# Patient Record
Sex: Male | Born: 1985 | Hispanic: Yes | Marital: Married | Smoking: Never smoker
Health system: Southern US, Community
[De-identification: ages and names within clinical notes are randomized; demographics above are authoritative.]

## PROBLEM LIST (undated history)

## (undated) DIAGNOSIS — H11009 Unspecified pterygium of unspecified eye: Secondary | ICD-10-CM

## (undated) HISTORY — DX: Unspecified pterygium of unspecified eye: H11.009

---

## 2014-02-01 ENCOUNTER — Ambulatory Visit (INDEPENDENT_AMBULATORY_CARE_PROVIDER_SITE_OTHER): Payer: 59 | Admitting: Family Medicine

## 2014-02-01 ENCOUNTER — Ambulatory Visit (INDEPENDENT_AMBULATORY_CARE_PROVIDER_SITE_OTHER): Payer: 59

## 2014-02-01 VITALS — BP 120/72 | HR 65 | Temp 98.2°F | Resp 18 | Ht 64.5 in | Wt 192.0 lb

## 2014-02-01 DIAGNOSIS — S5000XA Contusion of unspecified elbow, initial encounter: Secondary | ICD-10-CM

## 2014-02-01 DIAGNOSIS — M25529 Pain in unspecified elbow: Secondary | ICD-10-CM

## 2014-02-01 DIAGNOSIS — S40021A Contusion of right upper arm, initial encounter: Secondary | ICD-10-CM

## 2014-02-01 DIAGNOSIS — M25521 Pain in right elbow: Secondary | ICD-10-CM

## 2014-02-01 DIAGNOSIS — S40029A Contusion of unspecified upper arm, initial encounter: Secondary | ICD-10-CM

## 2014-02-01 DIAGNOSIS — S5001XA Contusion of right elbow, initial encounter: Secondary | ICD-10-CM

## 2014-02-01 LAB — POCT CBC
Granulocyte percent: 67.3 %G (ref 37–80)
HCT, POC: 43.9 % (ref 43.5–53.7)
Hemoglobin: 14.6 g/dL (ref 14.1–18.1)
Lymph, poc: 2.5 (ref 0.6–3.4)
MCH, POC: 30.2 pg (ref 27–31.2)
MCHC: 33.3 g/dL (ref 31.8–35.4)
MCV: 90.8 fL (ref 80–97)
MID (cbc): 0.6 (ref 0–0.9)
MPV: 8.8 fL (ref 0–99.8)
POC Granulocyte: 6.4 (ref 2–6.9)
POC LYMPH PERCENT: 26.3 %L (ref 10–50)
POC MID %: 6.4 %M (ref 0–12)
Platelet Count, POC: 247 10*3/uL (ref 142–424)
RBC: 4.83 M/uL (ref 4.69–6.13)
RDW, POC: 13.6 %
WBC: 9.5 10*3/uL (ref 4.6–10.2)

## 2014-02-01 MED ORDER — DICLOFENAC SODIUM 75 MG PO TBEC: 75.0000 mg | DELAYED_RELEASE_TABLET | Freq: Two times a day (BID) | ORAL | Status: DC

## 2014-02-01 NOTE — Progress Notes (Signed)
Subjective: Patient was doing some work at home Friday evening and fell from a 4 foot imb of a tree onto a little, striking the right inner elbow area or just above it. He has been hurting. He is now developed a large area of bruising and a painful area of swelling.  Objective: Right hand neurovascular intact. Hand strength good. Rotation of the forearm with pronation and supination causes pain. He has a large hematoma on the medial aspect of elbow which is tender and very ecchymotic. The swelling starts at about the mid biceps but the upper biceps and mid vastus don't seem as tender as he does more distally.  Assessment: Traumatic hematoma from contusion to right elbow with secondary ecchymosis and pain  Plan: X-ray elbow.   Results for orders placed in visit on 02/01/14  POCT CBC      Result Value Ref Range   WBC 9.5  4.6 - 10.2 K/uL   Lymph, poc 2.5  0.6 - 3.4   POC LYMPH PERCENT 26.3  10 - 50 %L   MID (cbc) 0.6  0 - 0.9   POC MID % 6.4  0 - 12 %M   POC Granulocyte 6.4  2 - 6.9   Granulocyte percent 67.3  37 - 80 %G   RBC 4.83  4.69 - 6.13 M/uL   Hemoglobin 14.6  14.1 - 18.1 g/dL   HCT, POC 16.1  09.6 - 53.7 %   MCV 90.8  80 - 97 fL   MCH, POC 30.2  27 - 31.2 pg   MCHC 33.3  31.8 - 35.4 g/dL   RDW, POC 04.5     Platelet Count, POC 247  142 - 424 K/uL   MPV 8.8  0 - 99.8 fL   We will probably just need to treat symptomatically.  [facility] reading (PRIMARY) by  Dr. Alwyn Ren Bones appear normal  Assessment: Traumatic hematoma and ecchymosis left elbow  Plan: Ice Analgesia  Return if not much better over the next week.

## 2014-02-01 NOTE — Patient Instructions (Addendum)
Further bruising is likely to occur, maybe only down into the hand.  Apply ice for about 15 minutes 3 or 4 times daily  Take diclofenac one twice daily for pain and inflammation  Can take Tylenol 1000 milligrams (acetaminophen) 3 times daily if needed for additional pain relief  Out of work through Wednesday

## 2014-02-04 ENCOUNTER — Ambulatory Visit (INDEPENDENT_AMBULATORY_CARE_PROVIDER_SITE_OTHER): Payer: 59 | Admitting: Family Medicine

## 2014-02-04 VITALS — BP 124/76 | HR 67 | Temp 98.2°F | Resp 16 | Ht 66.0 in | Wt 193.4 lb

## 2014-02-04 DIAGNOSIS — Z5189 Encounter for other specified aftercare: Secondary | ICD-10-CM

## 2014-02-04 DIAGNOSIS — S40021D Contusion of right upper arm, subsequent encounter: Secondary | ICD-10-CM

## 2014-02-04 DIAGNOSIS — S40029A Contusion of unspecified upper arm, initial encounter: Secondary | ICD-10-CM

## 2014-02-04 MED ORDER — CYCLOBENZAPRINE HCL 10 MG PO TABS: 10.0000 mg | ORAL_TABLET | Freq: Three times a day (TID) | ORAL | Status: DC | PRN

## 2014-02-04 MED ORDER — HYDROCODONE-ACETAMINOPHEN 5-325 MG PO TABS: 1.0000 | ORAL_TABLET | Freq: Four times a day (QID) | ORAL | Status: DC | PRN

## 2014-02-04 NOTE — Patient Instructions (Addendum)
Continue taking voltaren once a day for the next month.  Make sure you come back to clinic immediately if you are having worsening weakness or decreased ability to fully move your arm.  The cyclobenzaprine (flexeril) and hydrocodone will both make you sleepy and drowsy so do NOT take them before you work or drive.  Sndrome de atrapamiento, Tendinitis del manguito rotador, bursitis con rehabilitacin (Impingement Syndrome, Rotator Cuff, Bursitis, with Rehab) El sndrome de atrapamiento est caracterizado por la inflamacin de los tendones del manguito rotador o la bursa subacromial, que produce dolor en el hombro. El manguito rotador est formado por cuatro tendones y msculos que controlan gran parte de la funcin del hombro y Probation officer. La bursa subacromial es un saco lleno de lquido que ayuda a reducir la friccin entre el manguito rotador y uno de los huesos del hombro (acromion). El sndrome de atrapamiento suele ser Neomia Dear lesin por uso excesivo que causa inflamacin de la bursa (bursitis), inflamacin del tendn (tendinitis), o un desgarro del tendn (esguince). Los esguinces se clasifican en tres categoras. Los esguinces de grado 1 ocasionan dolor, pero el tendn no est alargado. En los esguinces de grado 2 hay un ligamento alargado, debido a un estiramiento o desgarro parcial. En el esguince de [place] 2 an se conserva la funcin, aunque puede estar disminuda. Un esguince en grado 3 es la ruptura completa del msculo o el tendn, y suele quedar incapacitada la funcin. SNTOMAS  Dolor alrededor del hombro, frecuentemente en la porcin externa de la parte superior del brazo.  El dolor empeora al mover el hombro, en especial cuando se lo levanta o se lo coloca por encima de la cabeza.  En ocasiones hay dolor cuando no se utiliza el brazo.  El dolor puede despertarlo durante la noche.  A veces, puede sentir dolor, sensibilidad, hinchazn, calor y enrojecimiento en la zona afectada.  Prdida de  la fuerza.  Movimiento limitado del hombro, en especial para moverlo hacia atrs (cmo para alcanzar el bolsillo trasero o desabrocharse el sostn) o al cruzar el cuerpo.  Ruido de "crack" (crepitacin) al Educational psychologist.  Puede sentir dolor en el tendn del bceps e hinchazn (en la zona anterior del hombro). Empeora al doblar el codo o levantar peso. CAUSAS El sndrome de compresin es a menudo una lesin por abuso, [employer] movimientos crnicos (repetitivos) ocasionan que los tendones o la bursa se inflamen. El esguince se produce cuando se produce una fuerza en el tendn o msculo que es mayor de lo que puede soportar. Los mecanismos ms frecuentes de una lesin son: Esguince provocado por un brusco incremento en la duracin, frecuencia o en la intensidad del entrenamiento.  Golpe directo en el hombro (traumatismo).  Envejecimiento, degeneracin del tendn con el uso normal.  Bulto seo en el hombro (osteofito acromial). LOS RIESGOS AUMENTAN CON  Deportes de contacto (ftbol, lucha o boxeo).  Deportes en los que hay que arrojar objetos (bisbol, tenis y vley).  Levantamiento de pesas y fisicoculturismo.  Trabajos pesados.  Lesin previa en el manguito rotador, inclusive choque.  Poca fuerza y flexibilidad del hombro.  Precalentamiento y elongacin inadecuados antes de la [place].  Equipo protector inadecuado.  La edad  Bulto seo en el hombro (osteofito acromial). MEDIDAS DE PREVENCIN  Precalentamiento adecuado y elongacin antes de la [place].  Descanso y recuperacin entre actividades.  Mantener la forma fsica:  E[place], flexibilidad y resistencia muscular.  Capacidad cardiovascular.  Aprenda y USAA. PRONSTICO Si  se trata adecuadamente, por lo general desaparece en 6 semanas. En algunos casos se requiere [place].  POSIBLES COMPLICACIONES:  Tiempo de curacin prolongado, si no se le da el tiempo suficiente como para  curarse.  La recurrencia frecuente de los sntomas puede dar como resultado un problema crnico.  Rigidez, parlisis del hombro o prdida de movimiento.  Desgarro del tendn del Engineer, drilling.  Recurrencia de sntomas, en especial si se retoma la actividad rpidamente, con el uso excesivo, con un golpe directo o con tcnicas incorrectas. CONSIDERACIONES GENERALES PARA EL TRATAMIENTO El tratamiento inicial consiste en la toma de medicamentos y la aplicacin de hielo para Engineer, materials y reducir la hinchazn. Los ejercicios de elongacin y fortalecimiento pueden ayudar a reducir Chief Technology Officer con la [place]. Los ejercicios pueden Management consultant o con un terapeuta. Si no se obtiene xito con Artist, ser necesario someterse a [place]. Luego de la [place] y rehabilitacin, es posible regresar a Audiological scientist en 3 meses.  MEDICAMENTOS   Si es necesaria la administracin de medicamentos para Chief Technology Officer, se recomiendan los antiinflamatorios no esteroides, (aspirina e ibuprofeno) u otros calmantes menores (acetaminofeno).  No tome medicamentos para el dolor dentro de los [address] previos a la [place].  El profesional podr prescribirle calmantes si lo considera necesario. Utilcelos como se le indique y slo cuando lo necesite.  En algunos casos se indica una inyeccin de corticosteroides. Estas inyecciones deben reservarse para los [place] graves, porque slo se pueden administrar una determinada cantidad de veces. CALOR Y FRO:   El fro debe aplicarse durante 10 a 15 minutos cada 2  3 horas para reducir la inflamacin y Chief Technology Officer e inmediatamente despus de cualquier actividad que agrava los sntomas. Utilice bolsas o un masaje de hielo.  El calor puede usarse antes de Therapist, music y de las actividades de fortalecimiento indicadas por el profesional, el fisioterapeuta o Orthoptist. Utilice una bolsa trmica o un pao hmedo. SOLICITE ATENCIN MDICA SI:   Los  sntomas empeoran o no mejoran en 4 a 6 semanas, an realizando Pharmacist, community.  Desarrolla nuevos e inexplicables sntomas. (las drogas [employer] en el tratamiento le ocasionan efectos secundarios). EJERCICIOS  EJERCICIOS DE AMPLITUD DE MOVIMIENTOS Y ELONGACIN - Sndrome de choque (manguito rotador tendinitis, bursitis) Estos ejercicios le ayudarn en la recuperacin de la lesin. Los sntomas podrn desaparecer con o sin mayor intervencin del profesional, el fisioterapeuta o Orthoptist. Al completar estos ejercicios, recuerde:   Restaurar la flexibilidad del tejido ayuda a que las articulaciones recuperen el movimiento normal. Esto permite que el movimiento y la actividad sea ms saludables y menos dolorosos.  Para que sea efectiva, cada elongacin debe realizarse durante al menos 30 segundos.  La elongacin nunca debe ser dolorosa. Deber sentir slo un alargamiento suave o elongacin del tejido que estira. FUERZA - Flexin, de pie  Pngase de pie con una buena postura. Con un agarre en supinacin en su mano derecha / izquierdo, y un agarre en pronacin de la mano opuesta, agarre un palo de escoba o caa de modo que sus manos queden un poco ms separadas que el ancho de los hombros.  Con los msculos del codo Sales executive / izquierdo rectos y los hombros relajados, empuje el bastn con la mano opuesta, para elevar su brazo derecha / izquierdo delante de su cuerpo y por encima de la cabeza. Levante el brazo hasta que sientas un estiramiento en el hombro derecha / izquierdo, pero sin sentir  un aumento del dolor.  Trate de Community education officer / izquierdo a Advertising account executive, y [place] el omplato inclinado hacia abajo y hacia la espina dorsal media de la espalda. Mantenga esta posicin durante __________ segundos.  Vuelva lentamente a la posicin inicial. Reptalo __________ veces. Realice este estiramiento __________ Anthoney Harada por da. FUERZA - Abduccin, posicin  supina  Acustese sobre la espada. Tome un palo de escoba o caa con una palma derecha / izquierdo hacia abajo y la otra palma hacia arriba de modo que sus manos tengan una separacin de un poco ms que el ancho de sus hombros..  Con los msculos del codo Sales executive / izquierdo rectos y los hombros relajados, empuje el bastn con la mano opuesta, para elevar su brazo derecha / izquierdo delante de su cuerpo y por encima de la cabeza. Levante el brazo hasta que sientas un estiramiento en el hombro derecha / izquierdo, pero sin sentir un aumento del dolor.  Trate de Community education officer / izquierdo a Advertising account executive, y [place] el omplato inclinado hacia abajo y hacia la espina dorsal media de la espalda. Mantenga esta posicin durante __________ segundos.  Vuelva lentamente a la posicin inicial. Reptalo __________ veces. Realice este estiramiento __________ Anthoney Harada por da.  ROM - Flexin, asistida activa  Acustese sobre la espada. Podr doblar las rodillas para estar ms cmodo  Tome un palo de escoba o un bastn de modo que sus manos queden a la misma distancia que sus hombros. La mano derecha / izquierdo debe sujetar el extremo del palo, de modo que la mano est "pulgares arriba", como si estuviera a punto de dar la [place].  Con su brazo sano para Science writer, Administrator / izquierdo sobre la cabeza, Teacher, adult education que sienta un suave estiramiento en el hombro. Mantenga esta posicin durante __________ segundos.  Utilice el bastn para ayudarse a Designer, jewellery / izquierdo a la posicin inicial. Reptalo __________ veces. Realice este estiramiento __________ Anthoney Harada por da.  ROM - Rotacin interna, en posicin supina  Recustese sobre su espalda en una superficie firme. Coloque el codo derecha / izquierdo a 60 grados de distancia de su lado. Eleve el codo con una toalla doblada, de manera que el codo y el hombro estn a la misma altura.  Utilice un palo de  escoba o caa y su brazo sano, tire de su mano derecha / izquierdo hacia su cuerpo hasta que sienta un suave estiramiento, pero ningn aumento en su dolor en el hombro. Mantenga su hombro y el codo en su lugar durante todo el ejercicio.  Mantenga esta posicin durante __________ segundos. Vuelva lentamente a la posicin inicial. Reptalo __________ veces. Realice este estiramiento __________ Anthoney Harada por da. ELONGACIN - Rotacin interna  Coloque la mano derecha / izquierdo detrs de la espalda, con la palma [place] arriba.  Coloque una toalla o un cinturn sobre el hombro opuesto. Tome la toalla con la mano derecha / izquierdo.  Con una postura erguida, tire suavemente hacia arriba la [place], hasta que sienta un estiramiento en la parte frontal del hombro derecha / izquierdo.  Trate de Community education officer / izquierdo a Advertising account executive, y [place] el omplato inclinado hacia abajo y hacia la espina dorsal media de la espalda.  Mantenga esta posicin durante __________ segundos. Afloje la elongacin bajando la mano sana. Reptalo __________ veces. Realice este estiramiento __________ Anthoney Harada por da.  ROM - Rotacin  interna  Tome un palo con ambas manos por detrs de la espalda, con las palmas Deputy.  De pie y erguido en buena postura, deslice el palo hacia arriba por la espalda hasta sentir un suave estiramiento en la zona anterior de los hombros.  Mantenga esta posicin durante __________ segundos. Vuelva lentamente a la posicin inicial. Reptalo __________ veces. Realice este estiramiento __________ Anthoney Harada por da.  ELONGACIN - Cpsula posterior del hombro  Prese o sintese en una postura correcta. Cruce el hombro derecha / izquierdo Dealer, y Therapist, nutritional a la misma altura que el hombro.  Tire del codo de [employer] el brazo quede cerca de su pecho. Tire hasta que sienta un estiramiento en la parte trasera del hombro.  Mantenga esta posicin durante  __________ segundos. Reptalo __________ veces. Realice este estiramiento __________ Anthoney Harada por da. EJERCICIOS DE ESTIRAMIENTO - Sndrome de choque (manguito rotador, tendinitis, bursitis) Estos ejercicios le ayudarn en la recuperacin de la lesin. Los sntomas podrn desaparecer con o sin mayor intervencin del profesional, el fisioterapeuta o Orthoptist. Al completar estos ejercicios, recuerde:   Los msculos pueden ganar tanto la resistencia como la fuerza que necesita para sus actividades diarias a travs de ejercicios controlados.  Realice los ejercicios como se lo indic el mdico, el fisioterapeuta o Orthoptist. Aumente la resistencia y las repeticiones segn se le haya indicado.  Podr experimentar dolor o cansancio muscular, pero el dolor o molestia que trata de eliminar a travs de los ejercicios nunca debe empeorar. Si el dolor empeora, detngase y asegrese de que est siguiendo las directivas correctamente. Si an siente dolor luego de Education officer, environmental lo ajustes necesarios, deber discontinuar el ejercicio hasta que pueda conversar con el profesional sobre el problema.  Durante la recuperacin, evite las actividades o ejercicios que impliquen acciones que le obliguen a Scientific laboratory technician la mano lesionada o el codo encima de la cabeza o detrs de la espalda o la cabeza. Estas posturas tensionan los tejidos que estn tratando de Barrister's clerk. FUERZA - Depresin y aduccin escapular   Sintese con una buena postura en una silla firme. Apoye los brazos delante de usted, con [place], reposabrazos, o sobre una mesa. Mantenga los codos en lnea con los lados de su cuerpo.  Lleve suavemente los omplatos hacia abajo y hacia la zona media posterior de la columna. Aumente gradualmente la tensin, sin tensar los msculos de la parte superior de los hombros y la parte posterior de su cuello.  Mantenga esta posicin durante __________ segundos. Libere lentamente la tensin y relaje los msculos antes de iniciar  la siguiente repeticin.  Despus de haber practicado este ejercicio, quite el soporte del brazo y complete el ejercicio de pie, de la misma manera que la posicin de sentado. Reptalo __________ veces. Realice este estiramiento __________ Anthoney Harada por da.  FUERZA - Abductores del hombro, isomtrica  Con una buena postura, de pie o sentado cerca de 4-6 pulgadas de la pared, con su lado derecha / izquierdo frente al muro.  Doble el codo derecha / izquierdo. Empuje suavemente el codo derecha / izquierdo contra la pared. Aumente gradualmente la presin [employer] pueda sin llegar a encojer el hombro ni Tax inspector.  Mantenga esta posicin durante __________ segundos.  Libere la tensin lentamente. Relaje los msculos de los hombros antes de empezar la siguiente repeticin. Reptalo __________ veces. Realice este estiramiento __________ Anthoney Harada por da.  FUERZA - Rotacin externa, isomtrica  Mantenga el codo derecha / izquierdo a su lado y dblelo  a 90 grados.  Prese en un marco de puerta para que el exterior de su Tax inspector / izquierdo pueda presionar contra ste sin separar su brazo de su lado.  Con suavidad presione la Tax inspector / izquierdo Lexicographer de la puerta, como si estuviera tratando de llevar el dorso de su mano lejos de su estmago. Aumente la presin de forma gradual, tan fuerte como usted pueda, sin encogerse de hombros ni sentir un aumento de las [employer].  Mantenga esta posicin durante __________ segundos.  Libere la tensin lentamente. Relaje los msculos de los hombros antes de empezar la siguiente repeticin. Reptalo __________ veces. Realice este estiramiento __________ Anthoney Harada por da.  FUERZA - Supraespinoso  Pngase de pie o sintese con una buena postura. Sostenga un peso de __________ Cheron Schaumann banda de goma para ejercicios, de modo que su mano quede con el pulgar hacia arriba, como cuando [employer].  Levante lentamente el  brazo derecha / izquierdo separndolo del muslo en forma de "V", en diagonal por el espacio entre el costado y el frente. Levante la mano [employer] altura del hombro o tanto como pueda, sin Tax inspector. Al principio, muchas personas no pueden levantar las manos por arriba de la altura del hombro.  Trate de Community education officer / izquierdo a Advertising account executive, y [place] el omplato inclinado hacia abajo y hacia la espina dorsal media de la espalda.  Mantenga esta posicin durante __________ segundos. Controle el descenso de la mano de modo que vuelva a la posicin inicial lo ms lentamente posible. Reptalo __________ veces. Realice este estiramiento __________ Anthoney Harada por da.  FUERZA - Rotadores externos  Asegure una banda o tubo de goma a un objeto fijo (mesa, columna) de modo que quede a la misma altura que su codo Sales executive / izquierdo Engineer, manufacturing systems se encuentre de pie o sentado sobre una superficie firme.  Prese o sintese de [employer]	[place] banda de goma se encuentre de su lado lesionado.  Doble el codo derecha / izquierdo a 90 grados. Coloque una toalla doblada o una pequea almohada debajo de su brazo derecha / izquierdo de modo que su codo quede algunas pulgadas separado de su cuerpo.  Manteniendo la tensin en la banda de goma, tire de la misma hacia afuera de su cuerpo, como pivoteando en el codo. Asegrese de [employer] su cuerpo derecho, de modo que el movimiento slo provenga de la rotacin del hombro.  Mantenga esta posicin durante __________ segundos. Libere la tensin de modo controlado mientras vuelve a la posicin inicial. Reptalo __________ veces. Realice este estiramiento __________ Anthoney Harada por da.  FUERZA - Rotadores internos  Asegure una banda o tubo de goma a un objeto fijo (mesa, columna) de modo que quede a la misma altura que su codo Sales executive / izquierdo Engineer, manufacturing systems se encuentre de pie o sentado sobre una superficie firme.  Prese o sintese de [employer] banda de goma se encuentre de su lado derecha / izquierdo.  Doble el codo a 90 grados. Coloque una toalla doblada o una pequea almohada debajo de su brazo derecha / izquierdo de modo que su codo quede algunas pulgadas separado de su cuerpo.  Manteniendo la tensin en la banda, tire de la misma cruzando sobre su cuerpo, [place]. Asegrese de [employer] su cuerpo derecho, de modo que el movimiento slo provenga de la rotacin del hombro.  Mantenga esta posicin durante __________ segundos. Libere la tensin  de modo controlado mientras vuelve a la posicin inicial. Reptalo __________ veces. Realice este estiramiento __________ Anthoney Harada por da.  FUERZA - Protractor escapular - de pie  Prese la distancia de sus brazos separado de la pared. Coloque las [employer] pared, [employer] codos derechos.  Comience con los omplatos hacia abajo y hacia la zona media posterior de la columna.  Para fortalecer los extensores, [address] los omplatos [place], [place] deslcelos hacia adelante sobre el trax. Sentir como si estuviera separada la zona posterior del trax de la pared. Es movimiento sutil y puede ser difcil de Education officer, environmental. Consulte con su mdico para que d ms instrucciones si no est seguro de Aeronautical engineer.  Mantenga esta posicin durante __________ segundos. Vuelva lentamente a la posicin inicial, y haga descansar completamente los msculos antes de repetir. Reptalo __________ veces. Realice este estiramiento __________ Anthoney Harada por da. FUERZA IT consultant - en posicin [place]  Recustese sobre su espalda en una superficie firme. Extienda su brazo derecha / izquierdo recto en el aire mientras sostiene un peso de __________ en la mano.  Mantenga la cabeza y la espalda en su lugar, levante los hombros del piso.  Mantenga esta posicin durante __________ segundos. Vuelva lentamente a la posicin inicial y relaje los msculos antes de iniciar la  siguiente repeticin. Reptalo __________ veces. Realice este estiramiento __________ Anthoney Harada por da. FUERZA - Protractor escapular - Office manager en las rodillas y las manos, con los hombros directamente sobre las manos (o tan prximos como pueda).  Manteniendo los hombros trabados, levante la zona posterior del trax hacia los omplatos, de modo que la zona media de la espalda se redondee. Mantenga los msculos del cuello relajados.  Mantenga esta posicin durante __________ segundos. Vuelva lentamente a la posicin inicial y relaje los msculos antes de iniciar la siguiente repeticin. Reptalo __________ veces. Realice este estiramiento __________ Anthoney Harada por da.  FUERZA - Retractor escapular  Asegure una banda o tubo de goma a un objeto fijo (mesa, columna) de modo que quede a la misma altura que sus hombros mientras se encuentre de pie o sentado en una silla firme sin apoyabrazos.  Con las palmas [place], tome un extremo de la banda con cada mano. Enderece los codos y levante las manos directamente frente usted, a la altura de los hombros. [place] hacia atrs, alejndose del extremo fijo de la banda, [place] que se tense.  Tratando de juntar presionando los omplatos, lleve los codos hacia atrs [employer] dobla. Mantenga los brazos separados del cuerpo durante todo el ejercicio.  Mantenga esta posicin durante __________ segundos. Lentamente libere la tensin de la banda, volviendo a la posicin inicial. Reptalo __________ veces. Realice este estiramiento __________ Anthoney Harada por da. FUERZA - Extensores del hombro  Asegure una banda o tubo de goma a un objeto fijo (mesa, columna) de modo que quede a la misma altura que sus hombros mientras se encuentre de pie o sentado en una silla firme sin apoyabrazos.  Con los pulgares [place], tome un extremo de la banda con cada mano. Enderece los codos y levante las manos directamente frente usted, a la altura de los hombros. Camine  hacia atrs, alejndose del extremo fijo de la banda, [place] que se tense.  Presionando los omplatos para juntarlos, lleve las manos a los lados de los muslos. No deje que las manos vayan ms all.  Mantenga esta posicin durante __________ segundos. Lentamente libere la tensin de la banda, volviendo a la posicin  inicial. Reptalo __________ veces. Realice este estiramiento __________ Anthoney Harada por da.  FUERZA - Retractores escapulares y rotadores externos  Asegure una banda o tubo de goma a un objeto fijo (mesa, columna) de modo que quede a la misma altura que sus hombros mientras se encuentre de pie o sentado en una silla firme sin apoyabrazos.  Con las palmas [place], tome un extremo de la banda con cada mano. Doble los hombros a 90 grados y [employer] codos [employer] altura de los hombros, a los lados. Camine hacia atrs, alejndose del extremo fijo de la banda, [place] que se tense.  Presionando los omplatos para juntarlos, rote los hombros de modo que la zona superior de los brazos y codos Special educational needs teacher quietos, [employer] puos se eleven hasta la altura de la cabeza.  Mantenga esta posicin durante __________ segundos. Lentamente libere la tensin de la banda, volviendo a la posicin inicial. Reptalo __________ veces. Realice este estiramiento __________ Anthoney Harada por da.  FUERZA - Retractores escapulares y rotadores externos - remo  Asegure una banda o tubo de goma a un objeto fijo (mesa, columna) de modo que quede a la misma altura que sus hombros mientras se encuentre de pie o sentado en una silla firme sin apoyabrazos.  Con las palmas [place], tome un extremo de la banda con cada mano. Enderece los codos y levante las manos directamente frente usted, a la altura de los hombros. Camine hacia atrs, alejndose del extremo fijo de la banda, [place] que se tense.  Paso 1: [employer] ambos omplatos. Doble los codos, lleve las manos al pecho, como si [place]. Al final de [employer], las  manos y codos deben quedar a la altura del hombro, y los codos deben estar separados de los lados.  Paso 2: Rote los hombros, para Optometrist las manos por arriba de la cabeza. Los antebrazos deben quedar verticales y la zona superior de los brazos debe quedar horizontal.  Mantenga esta posicin durante __________ segundos. Lentamente libere la tensin de la banda, volviendo a la posicin inicial. Reptalo __________ veces. Realice este estiramiento __________ Anthoney Harada por da.  FUERZA - Depresores escapulares  Busque una silla maciza sin ruedas, como una silla de comedor.  Manteniendo los pies sobre el piso y las manos en los apoyabrazos, levante las nalgas del asiento y trabe los codos.  Manteniendo los codos rectos, deje que la gravedad empuje su cuerpo [place] abajo. Los hombros se elevarn [employer].  Eleve el cuerpo oponindose a la gravedad, llevando los omplatos hacia la espalda y acortando la distancia entre los hombros y [place]. Aunque los pies mantengan contacto con el piso, deben soportar cada vez menos peso, a medida que se fortalece.  Mantenga esta posicin durante __________ segundos. De modo lento y controlado, baje el cuerpo para repetir. Reptalo __________ veces. Realice este estiramiento __________ Anthoney Harada por da.  Document Released: 02/21/2006 Document Revised: 07/30/2011 [facility] Patient Information 2015 [place], [place]. This information is not intended to replace advice given to you by your health care provider. Make sure you discuss any questions you have with your health care provider.

## 2014-02-04 NOTE — Progress Notes (Signed)
Subjective:    Patient ID: Travis Estrada, male    DOB: 1985-06-18, 28 y.o.   MRN: [id number] Chief Complaint  Patient presents with  . Follow-up    R arm pain is not better, pain keeps him up at night.     HPI  Was seen here Monday after he had fallen just 4 feet from a tree but he landed face down with his arms out and the medial inner aspect of his right arm. He was seen here by Dr. Alwyn Ren and diagnosed with a traumatic hematoma and started on bid diclofenac. He was taken out of work until today.   He states he has been having increasing pain and that the diclofenac hasn't helped.  He was unable to sleep last night due to pain.  In addition the bruising seems to be worsening. He is having some right shoulder pain with flexing and with abducting.  Past Medical History  Diagnosis Date  . Cataract    Current Outpatient Prescriptions on File Prior to Visit  Medication Sig Dispense Refill  . diclofenac (VOLTAREN) 75 MG EC tablet Take 1 tablet (75 mg total) by mouth 2 (two) times daily.  30 tablet  0  . acetaminophen (TYLENOL) 325 MG tablet Take 325 mg by mouth every 6 (six) hours as needed.       No current facility-administered medications on file prior to visit.   No Known Allergies   Review of Systems  Constitutional: Negative for fever, chills, diaphoresis, activity change and appetite change.  Musculoskeletal: Positive for arthralgias, joint swelling and myalgias. Negative for back pain, gait problem and neck pain.  Skin: Positive for color change. Negative for rash and wound.  Neurological: Positive for weakness. Negative for numbness.  Hematological: Negative for adenopathy. Does not bruise/bleed easily.  Psychiatric/Behavioral: Positive for sleep disturbance.       Objective:  BP 124/76  Pulse 67  Temp(Src) 98.2 F (36.8 C) (Oral)  Resp 16  Ht  (1.676 m)  Wt 193 lb 6.4 oz (87.726 kg)  BMI 31.23 kg/m2  SpO2 98%  Physical Exam  Constitutional: He is oriented to  person, place, and time. He appears well-developed and well-nourished. No distress.  HENT:  Head: Normocephalic and atraumatic.  Eyes: No scleral icterus.  Pulmonary/Chest: Effort normal.  Musculoskeletal:       Right shoulder: He exhibits tenderness and decreased strength. He exhibits normal range of motion, no bony tenderness and no swelling.       Left shoulder: Normal.       Right elbow: He exhibits normal range of motion and no swelling. No tenderness found.       Left elbow: Normal.       Right wrist: Normal. He exhibits normal range of motion, no tenderness and no bony tenderness.       Left wrist: Normal.  Neurological: He is alert and oriented to person, place, and time. He has normal strength. He displays no atrophy and no tremor. No sensory deficit. He exhibits normal muscle tone. Gait normal.  Slgiht 4+/5 weakness with right rotator cuff testing empty can test and bicep/tricep testing likely due to pain.  5/5 on all else including wrist forearm, deltoids  Skin: Skin is warm and dry. Ecchymosis noted. He is not diaphoretic.     Deep purple bruising over medial aspect of right arm proximal and distal to elbow  Psychiatric: He has a normal mood and affect. His behavior is normal.  elbow xray and cbc reviewed from 9/14 - both normal Assessment & Plan:   Traumatic hematoma of right upper arm, subsequent encounter Continue diclofenac x 1 mo, use below qhs prn pain. oow note given for 4 more days - then do not lift >50 lbs x 1 mo.  RTC for further eval with additional imaging and consideration of referral if any sxs remain after 1 mo or if any weakness or limitation in ROM develop.  Rotator cuff exercises in handout given. Meds ordered this encounter  Medications  . cyclobenzaprine (FLEXERIL) 10 MG tablet    Sig: Take 1 tablet (10 mg total) by mouth 3 (three) times daily as needed for muscle spasms.    Dispense:  30 tablet    Refill:  0  . HYDROcodone-acetaminophen  (NORCO/VICODIN) 5-325 MG per tablet    Sig: Take 1 tablet by mouth every 6 (six) hours as needed for moderate pain.    Dispense:  30 tablet    Refill:  0    Norberto Sorenson, MD MPH

## 2015-02-22 ENCOUNTER — Ambulatory Visit (INDEPENDENT_AMBULATORY_CARE_PROVIDER_SITE_OTHER): Payer: 59 | Admitting: Family Medicine

## 2015-02-22 VITALS — BP 128/98 | HR 63 | Temp 98.3°F | Resp 16 | Ht 65.0 in | Wt 189.0 lb

## 2015-02-22 DIAGNOSIS — R42 Dizziness and giddiness: Secondary | ICD-10-CM

## 2015-02-22 DIAGNOSIS — R059 Cough, unspecified: Secondary | ICD-10-CM

## 2015-02-22 DIAGNOSIS — R05 Cough: Secondary | ICD-10-CM | POA: Diagnosis not present

## 2015-02-22 DIAGNOSIS — J029 Acute pharyngitis, unspecified: Secondary | ICD-10-CM

## 2015-02-22 DIAGNOSIS — J069 Acute upper respiratory infection, unspecified: Secondary | ICD-10-CM

## 2015-02-22 LAB — POCT RAPID STREP A (OFFICE): Rapid Strep A Screen: NEGATIVE

## 2015-02-22 MED ORDER — BENZONATATE 100 MG PO CAPS: 200.0000 mg | ORAL_CAPSULE | Freq: Two times a day (BID) | ORAL | Status: DC | PRN

## 2015-02-22 MED ORDER — HYDROCODONE-HOMATROPINE 5-1.5 MG/5ML PO SYRP: 5.0000 mL | ORAL_SOLUTION | Freq: Every evening | ORAL | Status: DC | PRN

## 2015-02-22 MED ORDER — AZITHROMYCIN 250 MG PO TABS: ORAL_TABLET | ORAL | Status: DC

## 2015-02-22 NOTE — Progress Notes (Signed)
 Chief Complaint:  Chief Complaint  Patient presents with  . Cough    Onset yesterday  . Sore Throat  . Neck Pain    Back of neck  . Dizziness    HPI: Travis Estrada is a 29 y.o. male who reports to [facility] today complaining of 2 day hx of green productive cough, sore throat , he has headache and some dizziness, he has subjective fever. He has tried robitussin.  weak, he feels dizzy for a couple of seconds when he stands up, he has been tryign to drink. No rashes, nausea, vimiting, diarrhea, chills in th enight. 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 He has been tired but no msk aches. Has 3 young children but they are not sick. No TB sxs ie long standing cough, weight loss or night sweats.   Past Medical History  Diagnosis Date  . Pterygium    History reviewed. No pertinent past surgical history. Social History   Social History  . Marital Status: Married    Spouse Name: N/A  . Number of Children: N/A  . Years of Education: N/A   Social History Main Topics  . Smoking status: Never Smoker   . Smokeless tobacco: None  . Alcohol Use: Yes  . Drug Use: No  . Sexual Activity: Not Asked   Other Topics Concern  . None   Social History Narrative   Family History  Problem Relation Age of Onset  . Diabetes Mother   . Hypertension Sister    No Known Allergies Prior to Admission medications   Medication Sig Start Date End Date Taking? Authorizing Provider  acetaminophen (TYLENOL) 325 MG tablet Take 325 mg by mouth every 6 (six) hours as needed.    Historical Provider, MD     ROS: The patient denies  night sweats, unintentional weight loss, chest pain, palpitations, wheezing, dyspnea on exertion, nausea, vomiting, abdominal pain, dysuria, hematuria, melena, numbnes, or tingling.  All other systems have been reviewed and were otherwise negative with the exception of those mentioned in the HPI and as above.    PHYSICAL EXAM: Filed Vitals:   02/22/15 0914  BP: 128/98    Pulse: 63  Temp: 98.3 F (36.8 C)  Resp: 16   Body mass index is 31.45 kg/(m^2).   General: Alert, no acute distress HEENT:  Normocephalic, atraumatic, oropharynx patent. EOMI, PERRLA TM normal , +/- sinus sxs, throat is red, no exudates.  Cardiovascular:  Regular rate and rhythm, no rubs murmurs or gallops.  No Carotid bruits, radial pulse intact. No pedal edema.  Respiratory: Clear to auscultation bilaterally.  No wheezes, rales, or rhonchi.  No cyanosis, no use of accessory musculature Abdominal: No organomegaly, abdomen is soft and non-tender, positive bowel sounds. No masses. Skin: No rashes. Neurologic: Facial musculature symmetric. Psychiatric: Patient acts appropriately throughout our interaction. Lymphatic: No cervical or submandibular lymphadenopathy Musculoskeletal: Gait intact. No edema, tenderness Neg for meningeal signs 5/5 strength , no cerebellar dysfunction   LABS: Results for orders placed or performed in visit on 02/22/15  POCT rapid strep A  Result Value Ref Range   Rapid Strep A Screen Negative Negative     EKG/XRAY:   Primary read interpreted by Dr. Conley Rolls at [facility].   ASSESSMENT/PLAN: Encounter Diagnoses  Name Primary?  . Cough   . Acute pharyngitis, unspecified pharyngitis type Yes  . Dizziness and giddiness   . Acute URI    Likely viral URI Push fluids Rx tessalon, hycodan prn Rx  z pack if no improvement Fu prn , throat cx pending  Gross sideeffects, risk and benefits, and alternatives of medications d/w patient. Patient is aware that all medications have potential sideeffects and we are unable to predict every sideeffect or drug-drug interaction that may occur.    DO  02/22/2015 10:12 AM

## 2015-02-22 NOTE — Patient Instructions (Signed)
Infeccin de las vas areas superiores en los adultos (Upper Respiratory Infection, Adult)  La infeccin respiratoria de las vas areas superiores se conoce tambin como resfro comn. Las vas areas superiores incluyen los senos nasales, la garganta, la trquea, y los bronquios. Los bronquios son las vas areas que conducen el aire a los pulmones. La mayor parte de las personas mejora luego de una semana, pero los sntomas pueden durar hasta dos semanas. La tos residual puede durar ms. CAUSAS Varios tipos de virus pueden causar la infeccin de los tejidos que cubren las vas areas superiores. Los tejidos se irritan y se inflaman y se originan secreciones. Tambin es frecuente la produccin de moco. El resfro es contagioso. El virus se disemina fcilmente a otras personas por contacto oral. Aqu se incluyen los besos, el compartir un vaso y el toser o estornudar. Tambin puede diseminarse tocndose la boca o la nariz y luego tocando una superficie que luego tocan otras personas.  SNTOMAS Los sntomas se desarrollan entre uno y tres das luego de entrar en contacto con el virus. Pueden variar de una persona a otra. Incluyen:  Secrecin nasal.  Estornudos  Congestin nasal.  Irritacin de los senos nasales.  Dolor de garganta.  Prdida de la voz (laringitis).  Tos.  Fatiga.  Dolores musculares.  Prdida del apetito.  Dolor de cabeza.  Fiebre no muy elevada. DIAGNSTICO Puede diagnosticarse a s mismo la infeccin respiratoria, segn los sntomas habituales, ya que la mayor parte de las personas se resfra dos o tres veces al ao. El profesional puede confirmarlo basndose en el examen fsico. Lo ms importante es que el profesional verifique que los sntomas no se deben a otra enfermedad como anginas, sinusitis, neumona, asma o epiglotitis. Para diagnosticar el resfrio comn, no es necesario que haga anlisis de sangre, pruebas en la garganta o radiografas, pero en algunos  casos puede ser de utilidad para excluir otros problemas ms graves. El mdico decidir si necesita otras pruebas. RIESGOS Y COMPLICACIONES Tendr mayor riesgo de sufrir un resfro grave si consume cigarrillos, sufre una enfermedad cardaca (como insuficiencia cardaca) o pulmonar crnica (como asma) o si tiene un debilitamiento del sistema inmunolgico. Las personas muy jvenes o muy mayores tienen riesgo de sufrir infecciones ms graves. La sinusitis bacteriana, las infecciones del odo medio y la neumona bacteriana pueden complicar el resfro comn. El resfro puede exacerbar el asma y la enfermedad pulmonar obstructiva crnica. En algunos casos estas complicaciones requieren la atencin en un servicio de emergencias y pueden poner en peligro la vida. PREVENCIN La mejor manera de protegerse para no contraer un resfro es mantener una buena higiene. Evite el contacto bucal o de las manos con personas con sntomas de resfro. Si se produce el contacto, lvese las manos con frecuencia. No hay pruebas firmes que indiquen que la vitamina C, la vitamina E, la equincea o la actividad fsica reduzcan las posibilidades de tener una infeccin. Sin embargo, siempre se recomienda descansar mucho y tener una buena nutricin. TRATAMIENTO El tratamiento est dirigido a aliviar los sntomas. Esta enfermedad no tiene cura. Los antibiticos no son eficaces, ya que esta infeccin la causa un virus y no una bacteria. El tratamiento incluye:  Aumente la ingesta de lquidos. Consumo de bebidas deportivas, que proporcionan electrolitos,azcares e hidratacin.  Inhale vapor caliente (de un vaporizador o de la ducha).  Tomar sopa de pollo u otros lquidos claros, y mantener una buena nutricin.  Descanse lo suficiente.  Haga grgaras o coma pastillas para aliviar   las molestias.  Control de la fiebre con ibuprofeno o acetaminofen, segn las indicaciones del mdico.  Aumento del uso del inhalador, si sufre asma. Las  pastillas y los geles de zinc durante las primeras 24 horas de iniciado el resfro comn, pueden disminuir la duracin y aliviar la gravedad de los sntomas. Los medicamentos para el dolor pueden disminuir la fiebre, aliviar los dolores musculares y el dolor de garganta. Se dispone de una gran variedad de medicamentos de venta libre para tratar la congestin y la secrecin nasal. El profesional podr recomendarle inhalantes para los otros sntomas. INSTRUCCIONES PARA EL CUIDADO DOMICILIARIO  Utilice los medicamentos de venta libre o de prescripcin para el dolor, el malestar o la fiebre, segn se lo indique el profesional que lo asiste.  Utilice un vaporizador caliente o inhale vapor, haciendo salir agua de la ducha para aumentar la humedad ambiente. Esto mantendr las secreciones hmedas y Gilberta Peeters resultar ms fcil respirar.  Beba gran cantidad de lquido para mantener la orina de tono claro o color amarillo plido.  Descanse todo lo que pueda.  Regrese a su trabajo cuando la temperatura se haya normalizado, o cuando el profesional que lo asiste se lo indique. Quizs sea necesario que permanezca en su casa durante un tiempo ms prolongado para evitar infectar a otras personas. Tambin puede utilizar un barbijo y ser cuidadoso con el lavado de manos para evitar la diseminacin del virus. SOLICITE ATENCIN MDICA SI:  Luego de los primeros das siente que empeora en vez de mejorar.  Necesita que el profesional Jauna Raczynski brinde ms informacin relacionada con los medicamentos para controlar los sntomas.  Siente escalofros, Daymond Cordts falta el aire o escupe moco de color marrn o rojo. Estos pueden ser sntomas de neumona.  Tiene una secrecin nasal de color amarillo o marrn, o siente dolor en el rostro, especialmente cuando se inclina hacia adelante. Estos pueden ser sntomas de sinusitis.  Tiene fiebre, siente el cuello hinchado, tiene dolor al tragar u observa manchas blancas en el fondo de la garganta.  Estos pueden ser sntomas de angina por estreptococo. SOLICITE ATENCIN MDICA DE INMEDIATO SI:  Tiene fiebre.  Comienza a sentir un dolor de cabeza intenso o persistente, dolor de odos, en el seno nasal o en el pecho.  Tiene tos y esta se prolonga demasiado, tose y escupe sangre, la mucosidad habitual se modifica (si tiene una enfermedad pulmonar crnica) o respira con dificultad.  Siente rigidez en el cuello o dolor de cabeza intenso. Document Released: 02/14/2005 Document Revised: 07/30/2011 [employer] Patient Information 2015 [employer]. This information is not intended to replace advice given to you by your health care provider. Make sure you discuss any questions you have with your health care provider.  

## 2015-02-23 LAB — CULTURE, GROUP A STREP: Organism ID, Bacteria: NORMAL

## 2015-08-09 ENCOUNTER — Ambulatory Visit (INDEPENDENT_AMBULATORY_CARE_PROVIDER_SITE_OTHER): Payer: 59 | Admitting: Family Medicine

## 2015-08-09 VITALS — BP 114/70 | HR 76 | Temp 98.9°F | Resp 17 | Ht 65.0 in | Wt 188.0 lb

## 2015-08-09 DIAGNOSIS — J111 Influenza due to unidentified influenza virus with other respiratory manifestations: Secondary | ICD-10-CM

## 2015-08-09 MED ORDER — HYDROCODONE-ACETAMINOPHEN 7.5-325 MG/15ML PO SOLN: 10.0000 mL | Freq: Four times a day (QID) | ORAL | Status: DC | PRN

## 2015-08-09 MED ORDER — OSELTAMIVIR PHOSPHATE 75 MG PO CAPS: 75.0000 mg | ORAL_CAPSULE | Freq: Two times a day (BID) | ORAL | Status: DC

## 2015-08-09 NOTE — Progress Notes (Signed)
By signing my name below, I, Stann Ore, attest that this documentation has been prepared under the direction and in the presence of Elvina Sidle, MD. Electronically Signed: Stann Ore, Scribe. 08/09/2015 , 12:44 PM .  Patient was seen in room 8 .   Patient ID: Travis Estrada MRN: [id number], DOB: [birth year], 30 y.o. Date of Encounter: 08/09/2015  Primary Physician: No PCP Per Patient  Chief Complaint:  Chief Complaint  Patient presents with  . Sore Throat  . Cough  . Headache  . Fever    HPI:  Travis Estrada is a 30 y.o. male who presents to [facility] complaining of cough with sore throat, headache and fever. He states that he felt really fatigue and myalgia after work yesterday. He's taken some advil yesterday. He also informs having some sick contact at work.   Past Medical History  Diagnosis Date  . Pterygium      Home Meds: Prior to Admission medications   Medication Sig Start Date End Date Taking? Authorizing Provider  acetaminophen (TYLENOL) 325 MG tablet Take 325 mg by mouth every 6 (six) hours as needed.    Historical Provider, MD  azithromycin (ZITHROMAX) 250 MG tablet Take 2 tabs po now then the next day take 1 tab po daily for the next 5 days 02/22/15   Thao P Le, DO  benzonatate (TESSALON) 100 MG capsule Take 2 capsules (200 mg total) by mouth 2 (two) times daily as needed. 02/22/15   Thao P Le, DO  HYDROcodone-homatropine (HYCODAN) 5-1.5 MG/5ML syrup Take 5 mLs by mouth at bedtime as needed. 02/22/15   Thao P Le, DO    Allergies: No Known Allergies  Social History   Social History  . Marital Status: Married    Spouse Name: N/A  . Number of Children: N/A  . Years of Education: N/A   Occupational History  . Not on file.   Social History Main Topics  . Smoking status: Never Smoker   . Smokeless tobacco: Not on file  . Alcohol Use: Yes  . Drug Use: No  . Sexual Activity: Not on file   Other Topics Concern  . Not on file    Social History Narrative     Review of Systems: Constitutional: negative for chills, night sweats, weight changes; positive for fever, fatigue HEENT: negative for vision changes, hearing loss, congestion, rhinorrhea, epistaxis, or sinus pressure; positive for sore throat Cardiovascular: negative for chest pain or palpitations Respiratory: negative for hemoptysis, wheezing, shortness of breath; positive for cough Abdominal: negative for abdominal pain, nausea, vomiting, diarrhea, or constipation Dermatological: negative for rash Musc: positive for myalgia Neurologic: negative for dizziness, or syncope; positive for headache All other systems reviewed and are otherwise negative with the exception to those above and in the HPI.  Physical Exam: Blood pressure 114/70, pulse 76, temperature 98.9 F (37.2 C), temperature source Oral, resp. rate 17, height  (1.651 m), weight 188 lb (85.276 kg), SpO2 99 %., Body mass index is 31.28 kg/(m^2). General: Well developed, well nourished, in no acute distress. Head: Normocephalic, atraumatic, sclera non-icteric, nares are without discharge. Bilateral auditory canals clear, TM's are without perforation, pearly grey and translucent with reflective cone of light bilaterally. Throat mildly red, eyes mild conjunctiva Neck: Supple. No thyromegaly. Full ROM. No lymphadenopathy. Lungs: Clear bilaterally to auscultation without wheezes, rales, or rhonchi. Breathing is unlabored. Heart: RRR with S1 S2. No murmurs, rubs, or gallops appreciated. Abdomen: Soft, non-tender, non-distended with normoactive bowel  sounds. No hepatomegaly. No rebound/guarding. No obvious abdominal masses. Msk:  Strength and tone normal for age. Extremities/Skin: Warm and dry. No clubbing or cyanosis. No edema. No rashes or suspicious lesions. Neuro: Alert and oriented X 3. Moves all extremities spontaneously. Gait is normal. CNII-XII grossly in tact. Psych:  Responds to questions  appropriately with a normal affect.   Labs:  ASSESSMENT AND PLAN:  30 y.o. year old male with Influenza with respiratory manifestation - Plan: oseltamivir (TAMIFLU) 75 MG capsule, HYDROcodone-acetaminophen (HYCET) 7.5-325 mg/15 ml solution  This chart was scribed in my presence and reviewed by me personally.   Signed, Elvina SidleKurt Kimberleigh Mehan, MD 08/09/2015 12:44 PM   '

## 2015-08-09 NOTE — Patient Instructions (Addendum)
   IF you received an x-ray today, you will receive an invoice from [facility]. Please contact [facility] at [phone] with questions or concerns regarding your invoice.   IF you received labwork today, you will receive an invoice from [facility]. Please contact [facility] at [phone] with questions or concerns regarding your invoice.   Our billing staff will not be able to assist you with questions regarding bills from these companies.  You will be contacted with the lab results as soon as they are available. The fastest way to get your results is to activate your My Chart account. Instructions are located on the last page of this paperwork. If you have not heard from us regarding the results in 2 weeks, please contact this office.     Influenza, Adult Influenza ("the flu") is a viral infection of the respiratory tract. It occurs more often in winter months because people spend more time in close contact with one another. Influenza can make you feel very sick. Influenza easily spreads from person to person (contagious). CAUSES  Influenza is caused by a virus that infects the respiratory tract. You can catch the virus by breathing in droplets from an infected person's cough or sneeze. You can also catch the virus by touching something that was recently contaminated with the virus and then touching your mouth, nose, or eyes. RISKS AND COMPLICATIONS You may be at risk for a more severe case of influenza if you smoke cigarettes, have diabetes, have chronic heart disease (such as heart failure) or lung disease (such as asthma), or if you have a weakened immune system. Elderly people and pregnant women are also at risk for more serious infections. The most common problem of influenza is a lung infection (pneumonia). Sometimes, this problem can require emergency medical care and may be life threatening. SIGNS AND SYMPTOMS  Symptoms typically last 4  to 10 days and may include:  Fever.  Chills.  Headache, body aches, and muscle aches.  Sore throat.  Chest discomfort and cough.  Poor appetite.  Weakness or feeling tired.  Dizziness.  Nausea or vomiting. DIAGNOSIS  Diagnosis of influenza is often made based on your history and a physical exam. A nose or throat swab test can be done to confirm the diagnosis. TREATMENT  In mild cases, influenza goes away on its own. Treatment is directed at relieving symptoms. For more severe cases, your health care provider may prescribe antiviral medicines to shorten the sickness. Antibiotic medicines are not effective because the infection is caused by a virus, not by bacteria. HOME CARE INSTRUCTIONS  Take medicines only as directed by your health care provider.  Use a cool mist humidifier to make breathing easier.  Get plenty of rest until your temperature returns to normal. This usually takes 3 to 4 days.  Drink enough fluid to keep your urine clear or pale yellow.  Cover yourmouth and nosewhen coughing or sneezing,and wash your handswellto prevent thevirusfrom spreading.  Stay homefromwork orschool untilthe fever is gonefor at least 1full day. PREVENTION  An annual influenza vaccination (flu shot) is the best way to avoid getting influenza. An annual flu shot is now routinely recommended for all adults in the [place] SEEK MEDICAL CARE IF:  You experiencechest pain, yourcough worsens,or you producemore mucus.  Youhave nausea,vomiting, ordiarrhea.  Your fever returns or gets worse. SEEK IMMEDIATE MEDICAL CARE IF:  You havetrouble breathing, you become short of breath,or your skin ornails becomebluish.  You have severe painor   stiffnessin the neck.  You develop a sudden headache, or pain in the face or ear.  You have nausea or vomiting that you cannot control. MAKE SURE YOU:   Understand these instructions.  Will watch your condition.  Will get help  right away if you are not doing well or get worse.   This information is not intended to replace advice given to you by your health care provider. Make sure you discuss any questions you have with your health care provider.   Document Released: 05/04/2000 Document Revised: 05/28/2014 Document Reviewed: 08/06/2011 Elsevier Interactive Patient Education 2016 [employer]  

## 2015-11-01 ENCOUNTER — Ambulatory Visit (INDEPENDENT_AMBULATORY_CARE_PROVIDER_SITE_OTHER): Payer: 59 | Admitting: Physician Assistant

## 2015-11-01 VITALS — BP 122/72 | HR 88 | Temp 99.1°F | Resp 17 | Ht 65.0 in | Wt 192.0 lb

## 2015-11-01 DIAGNOSIS — R51 Headache: Secondary | ICD-10-CM | POA: Diagnosis not present

## 2015-11-01 DIAGNOSIS — R059 Cough, unspecified: Secondary | ICD-10-CM

## 2015-11-01 DIAGNOSIS — J029 Acute pharyngitis, unspecified: Secondary | ICD-10-CM

## 2015-11-01 DIAGNOSIS — R05 Cough: Secondary | ICD-10-CM | POA: Diagnosis not present

## 2015-11-01 DIAGNOSIS — R519 Headache, unspecified: Secondary | ICD-10-CM

## 2015-11-01 LAB — POCT CBC
Granulocyte percent: 78.3 %G (ref 37–80)
HCT, POC: 40.1 % — AB (ref 43.5–53.7)
Hemoglobin: 14 g/dL — AB (ref 14.1–18.1)
Lymph, poc: 1.4 (ref 0.6–3.4)
MCH, POC: 30.6 pg (ref 27–31.2)
MCHC: 34.9 g/dL (ref 31.8–35.4)
MCV: 87.7 fL (ref 80–97)
MID (cbc): 0.8 (ref 0–0.9)
MPV: 8.7 fL (ref 0–99.8)
POC Granulocyte: 7.8 — AB (ref 2–6.9)
POC LYMPH PERCENT: 13.7 %L (ref 10–50)
POC MID %: 8 %M (ref 0–12)
Platelet Count, POC: 196 10*3/uL (ref 142–424)
RBC: 4.57 M/uL — AB (ref 4.69–6.13)
RDW, POC: 13.4 %
WBC: 10 10*3/uL (ref 4.6–10.2)

## 2015-11-01 LAB — POCT RAPID STREP A (OFFICE): Rapid Strep A Screen: NEGATIVE

## 2015-11-01 MED ORDER — CETIRIZINE-PSEUDOEPHEDRINE ER 5-120 MG PO TB12: 1.0000 | ORAL_TABLET | Freq: Two times a day (BID) | ORAL | Status: DC

## 2015-11-01 MED ORDER — HYDROCODONE-HOMATROPINE 5-1.5 MG/5ML PO SYRP: 2.5000 mL | ORAL_SOLUTION | Freq: Every evening | ORAL | Status: DC | PRN

## 2015-11-01 MED ORDER — IBUPROFEN 800 MG PO TABS: 400.0000 mg | ORAL_TABLET | Freq: Three times a day (TID) | ORAL | Status: AC

## 2015-11-01 MED ORDER — KETOROLAC TROMETHAMINE 60 MG/2ML IM SOLN
60.0000 mg | Freq: Once | INTRAMUSCULAR | Status: AC
  Administered 2015-11-01: 60 mg via INTRAMUSCULAR

## 2015-11-01 NOTE — Progress Notes (Signed)
11/01/2015 9:01 AM   DOB: 06/24/1985 / MRN: [id number]  SUBJECTIVE:  Travis Estrada is a 30 y.o. male presenting for sore throat, generalized non pulsatile HA, cough and subjective fever that started yesterday.  He feels he is getting worse. Has not tried any medication.  No sick contacts. Denies tic bites, works in a warehouse.     He has No Known Allergies.   He  has a past medical history of Pterygium.    He  reports that he has never smoked. He does not have any smokeless tobacco history on file. He reports that he drinks alcohol. He reports that he does not use illicit drugs. He  has no sexual activity history on file. The patient  has no past surgical history on file.  His family history includes Diabetes in his mother; Hypertension in his sister.  Review of Systems  Constitutional: Positive for fever and malaise/fatigue. Negative for chills and diaphoresis.  HENT: Positive for congestion.   Eyes: Negative for pain.  Respiratory: Negative for cough and shortness of breath.   Cardiovascular: Negative for chest pain.  Gastrointestinal: Negative for nausea and abdominal pain.  Musculoskeletal: Negative for myalgias and joint pain.  Skin: Negative for itching and rash.  Neurological: Positive for headaches. Negative for dizziness and tingling.  Psychiatric/Behavioral: Negative for depression. The patient is not nervous/anxious.     Problem list and medications reviewed and updated by myself where necessary, and exist elsewhere in the encounter.   OBJECTIVE:  BP 122/72 mmHg  Pulse 88  Temp(Src) 99.1 F (37.3 C) (Oral)  Resp 17  Ht 5\' 5"  (1.651 m)  Wt 192 lb (87.091 kg)  BMI 31.95 kg/m2  SpO2 96%  Physical Exam  Constitutional: He is oriented to person, place, and time. He appears well-developed. He does not appear ill.  HENT:  Right Ear: Tympanic membrane normal.  Left Ear: Tympanic membrane normal.  Nose: Nose normal.  Eyes: Conjunctivae and EOM are normal. Pupils are  equal, round, and reactive to light.  Cardiovascular: Normal rate and regular rhythm.   Pulmonary/Chest: Effort normal and breath sounds normal. No respiratory distress. He has no wheezes. He has no rales. He exhibits no tenderness.  Abdominal: Soft. Bowel sounds are normal.  Musculoskeletal: Normal range of motion.  Neurological: He is alert and oriented to person, place, and time. No cranial nerve deficit. Coordination normal.  Skin: Skin is warm and dry. He is not diaphoretic.  Psychiatric: He has a normal mood and affect.  Nursing note and vitals reviewed.   Results for orders placed or performed in visit on 11/01/15 (from the past 72 hour(s))  POCT rapid strep A     Status: None   Collection Time: 11/01/15  8:42 AM  Result Value Ref Range   Rapid Strep A Screen Negative Negative  POCT CBC     Status: Abnormal   Collection Time: 11/01/15  8:49 AM  Result Value Ref Range   WBC 10.0 4.6 - 10.2 K/uL   Lymph, poc 1.4 0.6 - 3.4   POC LYMPH PERCENT 13.7 10 - 50 %L   MID (cbc) 0.8 0 - 0.9   POC MID % 8.0 0 - 12 %M   POC Granulocyte 7.8 (A) 2 - 6.9   Granulocyte percent 78.3 37 - 80 %G   RBC 4.57 (A) 4.69 - 6.13 M/uL   Hemoglobin 14.0 (A) 14.1 - 18.1 g/dL   HCT, POC 41.340.1 (A) 24.443.5 - 53.7 %   MCV  87.7 80 - 97 fL   MCH, POC 30.6 27 - 31.2 pg   MCHC 34.9 31.8 - 35.4 g/dL   RDW, POC 16.1 %   Platelet Count, POC 196 142 - 424 K/uL   MPV 8.7 0 - 99.8 fL    No results found.  ASSESSMENT AND PLAN  Red was seen today for cough, sore throat, headache and fatigue.  Diagnoses and all orders for this visit:  Sore throat Comments: His symptoms are viral overall.  Strep culture is out.  There is no rash.  Advising that he return on Friday morning if he is not improved.  Orders: -     POCT CBC -     POCT rapid strep A -     Culture, Group A Strep  Acute nonintractable headache, unspecified headache type -     ketorolac (TORADOL) injection 60 mg; Inject 2 mLs (60 mg total) into the  muscle once.       -     ibuprofen (ADVIL,MOTRIN) 800 MG tablet; Take 0.5-1 tablets (400-800 mg total) by mouth 3 (three) times daily. Take with food.  Cough -     HYDROcodone-homatropine (HYCODAN) 5-1.5 MG/5ML syrup; Take 2.5-5 mLs by mouth at bedtime as needed. -     cetirizine-pseudoephedrine (ZYRTEC-D ALLERGY & CONGESTION) 5-120 MG tablet; Take 1 tablet by mouth 2 (two) times daily.    The patient was advised to call or return to clinic if he does not see an improvement in symptoms or to seek the care of the closest emergency department if he worsens with the above plan.   Travis Estrada, MHS, PA-C [facility] 11/01/2015 9:01 AM

## 2015-11-01 NOTE — Patient Instructions (Signed)
     IF you received an x-ray today, you will receive an invoice from [facility]. Please contact [facility] at [phone] with questions or concerns regarding your invoice.   IF you received labwork today, you will receive an invoice from [facility]. Please contact [facility] at [phone] with questions or concerns regarding your invoice.   Our billing staff will not be able to assist you with questions regarding bills from these companies.  You will be contacted with the lab results as soon as they are available. The fastest way to get your results is to activate your My Chart account. Instructions are located on the last page of this paperwork. If you have not heard from us regarding the results in 2 weeks, please contact this office.      

## 2015-11-02 ENCOUNTER — Telehealth: Payer: Self-pay | Admitting: Emergency Medicine

## 2015-11-02 LAB — CULTURE, GROUP A STREP: Organism ID, Bacteria: NORMAL

## 2015-11-02 NOTE — Telephone Encounter (Signed)
-----   Message from Ofilia NeasMichael L Clark, PA-C sent at 11/02/2015  3:02 PM EDT ----- Please make patient aware of results via letter or phone call.  Deliah BostonMichael Clark PA-C, 11/02/2015 3:02 PM

## 2015-11-02 NOTE — Telephone Encounter (Signed)
Pt given negative results.

## 2015-11-03 ENCOUNTER — Other Ambulatory Visit: Payer: Self-pay | Admitting: Physician Assistant

## 2015-11-03 ENCOUNTER — Encounter: Payer: Self-pay | Admitting: Emergency Medicine

## 2015-12-19 ENCOUNTER — Ambulatory Visit: Payer: 59 | Admitting: Family Medicine

## 2015-12-19 VITALS — BP 104/70 | HR 65 | Temp 98.1°F | Resp 16 | Ht 65.0 in | Wt 186.6 lb

## 2015-12-19 DIAGNOSIS — R109 Unspecified abdominal pain: Secondary | ICD-10-CM

## 2015-12-19 DIAGNOSIS — R197 Diarrhea, unspecified: Secondary | ICD-10-CM | POA: Diagnosis not present

## 2015-12-19 DIAGNOSIS — Z0000 Encounter for general adult medical examination without abnormal findings: Secondary | ICD-10-CM

## 2015-12-19 LAB — COMPREHENSIVE METABOLIC PANEL
ALT: 37 U/L (ref 9–46)
AST: 26 U/L (ref 10–40)
Albumin: 4.6 g/dL (ref 3.6–5.1)
Alkaline Phosphatase: 60 U/L (ref 40–115)
BUN: 16 mg/dL (ref 7–25)
CO2: 28 mmol/L (ref 20–31)
Calcium: 9.3 mg/dL (ref 8.6–10.3)
Chloride: 100 mmol/L (ref 98–110)
Creat: 0.99 mg/dL (ref 0.60–1.35)
Glucose, Bld: 86 mg/dL (ref 65–99)
Potassium: 3.8 mmol/L (ref 3.5–5.3)
Sodium: 138 mmol/L (ref 135–146)
Total Bilirubin: 0.8 mg/dL (ref 0.2–1.2)
Total Protein: 7.2 g/dL (ref 6.1–8.1)

## 2015-12-19 MED ORDER — DIPHENOXYLATE-ATROPINE 2.5-0.025 MG PO TABS: 1.0000 | ORAL_TABLET | Freq: Four times a day (QID) | ORAL | 1 refills | Status: DC | PRN

## 2015-12-19 NOTE — Progress Notes (Signed)
   Patient ID: Travis Estrada, male    DOB: 08-16-1985, 30 y.o.   MRN: [id number]  PCP: Joaquin Courts, FNP  Chief Complaint  Patient presents with  . Diarrhea    started early this morning, employer made him come in to make sure he is not contagious     Subjective:   HPI Presents for evaluation of diarrhea today. At work starting having several bowel movements. Yesterday abdominal pain but relieve with bowel movement. Ate soup yesterday. No nausea, vomiting, fever, or chills. Some fatigued times 1 day. Tolerating fluids.  Normal bowel pattern and frequency is typically once per day. Need a note to return back to work Wednesday.  . Social History   Social History  . Marital status: Married    Spouse name: N/A  . Number of children: N/A  . Years of education: N/A   Occupational History  . Not on file.   Social History Main Topics  . Smoking status: Never Smoker  . Smokeless tobacco: Never Used  . Alcohol use Yes  . Drug use: No  . Sexual activity: Not on file   Other Topics Concern  . Not on file   Social History Narrative  . No narrative on file    . Family History  Problem Relation Age of Onset  . Diabetes Mother   . Hypertension Sister      Review of Systems  Constitutional: Positive for fatigue.  HENT: Negative.   Respiratory: Negative.   Cardiovascular: Negative.   Gastrointestinal: Positive for diarrhea.       There are no active problems to display for this patient.    Prior to Admission medications   Not on File     No Known Allergies     Objective:  Physical Exam  Constitutional: He is oriented to person, place, and time. He appears well-developed and well-nourished.  HENT:  Head: Normocephalic.  Eyes: Conjunctivae and EOM are normal. Pupils are equal, round, and reactive to light.  Neck: Normal range of motion. Neck supple.  Cardiovascular: Normal rate, regular rhythm, normal heart sounds and intact distal pulses.     Pulmonary/Chest: Effort normal and breath sounds normal.  Abdominal: Soft. Bowel sounds are normal. He exhibits no distension and no mass. There is no tenderness. There is no rebound and no guarding.  Musculoskeletal: Normal range of motion.  Neurological: He is alert and oriented to person, place, and time.  Skin: Skin is warm and dry.  Psychiatric: He has a normal mood and affect. His behavior is normal. Judgment and thought content normal.           Assessment & Plan:  1. Diarrhea, unspecified type 2. Abdominal Discomfort  Patient is likely suffering from acute gastritis causing diarrheal type stools.  He is negative of fever, and does not appear visible ill or dehydrated. Take diphenoxylate-atropine (lomotil) 2.3-0.025 as needed for diarrhea.  Godfrey Pick. Tiburcio Pea, MSN, FNP-C [facility]

## 2015-12-19 NOTE — Patient Instructions (Addendum)
Increase fluid intake as tolerated. Take lomotil as needed for diarrhea. Avoid greasy or fatty food while stools are loose.  IF you received an x-ray today, you will receive an invoice from [facility]. Please contact [facility] at [phone] with questions or concerns regarding your invoice.   IF you received labwork today, you will receive an invoice from [employer]. Please contact [facility] at [phone] with questions or concerns regarding your invoice.   Our billing staff will not be able to assist you with questions regarding bills from these companies.  You will be contacted with the lab results as soon as they are available. The fastest way to get your results is to activate your My Chart account. Instructions are located on the last page of this paperwork. If you have not heard from [place] regarding the results in 2 weeks, please contact this office.   Diarrhea Diarrhea is frequent loose and watery bowel movements. It can cause you to feel weak and dehydrated. Dehydration can cause you to become tired and thirsty, have a dry mouth, and have decreased urination that often is dark yellow. Diarrhea is a sign of another problem, most often an infection that will not last long. In most cases, diarrhea typically lasts 2-3 days. However, it can last longer if it is a sign of something more serious. It is important to treat your diarrhea as directed by your caregiver to lessen or prevent future episodes of diarrhea. CAUSES  Some common causes include:  Gastrointestinal infections caused by viruses, bacteria, or parasites.  Food poisoning or food allergies.  Certain medicines, such as antibiotics, chemotherapy, and laxatives.  Artificial sweeteners and fructose.  Digestive disorders. HOME CARE INSTRUCTIONS  Ensure adequate fluid intake (hydration): Have 1 cup (8 oz) of fluid for each diarrhea episode. Avoid fluids that contain simple sugars or  sports drinks, fruit juices, whole milk products, and sodas. Your urine should be clear or pale yellow if you are drinking enough fluids. Hydrate with an oral rehydration solution that you can purchase at pharmacies, retail stores, and online. You can prepare an oral rehydration solution at home by mixing the following ingredients together:   - tsp table salt.   tsp baking soda.   tsp salt substitute containing potassium chloride.  1  tablespoons sugar.  1 L (34 oz) of water.  Certain foods and beverages may increase the speed at which food moves through the gastrointestinal (GI) tract. These foods and beverages should be avoided and include:  Caffeinated and alcoholic beverages.  High-fiber foods, such as raw fruits and vegetables, nuts, seeds, and whole grain breads and cereals.  Foods and beverages sweetened with sugar alcohols, such as xylitol, sorbitol, and mannitol.  Some foods may be well tolerated and may help thicken stool including:  Starchy foods, such as rice, toast, pasta, low-sugar cereal, oatmeal, grits, baked potatoes, crackers, and bagels.  Bananas.  Applesauce.  Add probiotic-rich foods to help increase healthy bacteria in the GI tract, such as yogurt and fermented milk products.  Wash your hands well after each diarrhea episode.  Only take over-the-counter or prescription medicines as directed by your caregiver.  Take a warm bath to relieve any burning or pain from frequent diarrhea episodes. SEEK IMMEDIATE MEDICAL CARE IF:   You are unable to keep fluids down.  You have persistent vomiting.  You have blood in your stool, or your stools are black and tarry.  You do not urinate in 6-8 hours, or there is  only a small amount of very dark urine.  You have abdominal pain that increases or localizes.  You have weakness, dizziness, confusion, or light-headedness.  You have a severe headache.  Your diarrhea gets worse or does not get better.  You have  a fever or persistent symptoms for more than 2-3 days.  You have a fever and your symptoms suddenly get worse. MAKE SURE YOU:   Understand these instructions.  Will watch your condition.  Will get help right away if you are not doing well or get worse.   This information is not intended to replace advice given to you by your health care provider. Make sure you discuss any questions you have with your health care provider.   Document Released: 04/27/2002 Document Revised: 05/28/2014 Document Reviewed: 01/13/2012 Elsevier Interactive Patient Education [employer]

## 2015-12-21 ENCOUNTER — Encounter: Payer: Self-pay | Admitting: Family Medicine

## 2015-12-21 NOTE — Progress Notes (Signed)
Letter sent to patient advising of normal lab results.

## 2016-03-05 ENCOUNTER — Ambulatory Visit (INDEPENDENT_AMBULATORY_CARE_PROVIDER_SITE_OTHER): Payer: 59 | Admitting: Family Medicine

## 2016-03-05 ENCOUNTER — Ambulatory Visit (INDEPENDENT_AMBULATORY_CARE_PROVIDER_SITE_OTHER): Payer: 59

## 2016-03-05 VITALS — BP 114/80 | HR 81 | Temp 98.4°F | Resp 16 | Ht 65.0 in | Wt 195.2 lb

## 2016-03-05 DIAGNOSIS — M5440 Lumbago with sciatica, unspecified side: Secondary | ICD-10-CM | POA: Diagnosis not present

## 2016-03-05 MED ORDER — PREDNISONE 20 MG PO TABS: ORAL_TABLET | ORAL | 0 refills | Status: DC

## 2016-03-05 MED ORDER — TRAMADOL HCL 50 MG PO TABS: 50.0000 mg | ORAL_TABLET | Freq: Three times a day (TID) | ORAL | 0 refills | Status: DC | PRN

## 2016-03-05 NOTE — Patient Instructions (Addendum)
Take Prednisone 20 mg,  in mornings with breakfast as follows:  Take 3 pills for 3 days, Take 2 pills for 3 days, and Take 1 pill for 3 days. Complete all medication.  Do not take any ibuprofen or naproxen while taking Prednisone.  Tramadol 50 mg every 8 hours as needed for pain.   Godfrey PickKimberly S. Tiburcio PeaHarris, MSN, FNP-C [facility]  IF you received an x-ray today, you will receive an invoice from [facility]. Please contact [facility] at [phone] with questions or concerns regarding your invoice.   IF you received labwork today, you will receive an invoice from [facility]. Please contact [facility] at [phone] with questions or concerns regarding your invoice.   Our billing staff will not be able to assist you with questions regarding bills from these companies.  You will be contacted with the lab results as soon as they are available. The fastest way to get your results is to activate your My Chart account. Instructions are located on the last page of this paperwork. If you have not heard from us regarding the results in 2 weeks, please contact this office.    Sciatica Sciatica is pain, weakness, numbness, or tingling along your sciatic nerve. The nerve starts in the lower back and runs down the back of each leg. Nerve damage or certain conditions pinch or put pressure on the sciatic nerve. This causes the pain, weakness, and other discomforts of sciatica. HOME CARE   Only take medicine as told by your doctor.  Apply ice to the affected area for 20 minutes. Do this 3-4 times a day for the first 48-72 hours. Then try heat in the same way.  Exercise, stretch, or do your usual activities if these do not make your pain worse.  Go to physical therapy as told by your doctor.  Keep all doctor visits as told.  Do not wear high heels or shoes that are not supportive.  Get a firm mattress if your  mattress is too soft to lessen pain and discomfort. GET HELP RIGHT AWAY IF:   You cannot control when you poop (bowel movement) or pee (urinate).  You have more weakness in your lower back, lower belly (pelvis), butt (buttocks), or legs.  You have redness or puffiness (swelling) of your back.  You have a burning feeling when you pee.  You have pain that gets worse when you lie down.  You have pain that wakes you from your sleep.  Your pain is worse than past pain.  Your pain lasts longer than 4 weeks.  You are suddenly losing weight without reason. MAKE SURE YOU:   Understand these instructions.  Will watch this condition.  Will get help right away if you are not doing well or get worse.   This information is not intended to replace advice given to you by your health care provider. Make sure you discuss any questions you have with your health care provider.   Document Released: 02/14/2008 Document Revised: 01/26/2015 Document Reviewed: 09/16/2011 Elsevier Interactive Patient Education [employer] Elsevier Inc.

## 2016-03-05 NOTE — Progress Notes (Signed)
   Patient ID: Travis Estrada, male    DOB: 10/18/1985, 30 y.o.   MRN: [id number]  PCP: Joaquin CourtsKimberly Cataleya Cristina, FNP  Chief Complaint  Patient presents with  . Hip Pain    right hip pain that radiates to right knee x 4 weeks     Subjective:   HPI 30 year old,  male presents for evaluation of right low back pain times 4 weeks.  He reports that low back pain started abruptly and has gradually progressed and worsened.  Pain is achy to sharp and radiates into the right side of buttocks. Pain is worst with extension of right leg and when rising from a sitting to standing position.  He denies injury.He feels like right leg has gotten weaker since pain started.  He has taken ibuprofen with mild relief of pain.  Social History   Social History  . Marital status: Married    Spouse name: N/A  . Number of children: N/A  . Years of education: N/A   Occupational History  . Not on file.   Social History Main Topics  . Smoking status: Never Smoker  . Smokeless tobacco: Never Used  . Alcohol use Yes  . Drug use: No  . Sexual activity: Not on file   Other Topics Concern  . Not on file   Social History Narrative  . No narrative on file   Family History  Problem Relation Age of Onset  . Diabetes Mother   . Hypertension Sister     Review of Systems  See HPI There are no active problems to display for this patient.  Prior to Admission medications   Not on File     No Known Allergies     Objective:  Physical Exam  Constitutional: He is oriented to person, place, and time. He appears well-developed and well-nourished.  HENT:  Head: Normocephalic and atraumatic.  Right Ear: External ear normal.  Left Ear: External ear normal.  Nose: Nose normal.  Musculoskeletal:       Thoracic back: He exhibits tenderness and bony tenderness.       Back:  Neurological: He is oriented to person, place, and time. He has normal strength. He is unresponsive.  Reflex Scores:      Patellar reflexes  are 1+ on the right side and 2+ on the left side.      Achilles reflexes are 1+ on the right side and 1+ on the left side. Skin: Skin is warm and dry.  Psychiatric: He has a normal mood and affect. His behavior is normal. Judgment and thought content normal.     Dg Lumbar Spine Complete  Result Date: 03/05/2016 CLINICAL DATA:  Low back pain with sciatica. EXAM: LUMBAR SPINE - COMPLETE 4+ VIEW COMPARISON:  No recent prior. FINDINGS: No acute bony abnormality identified. Normal alignment and mineralization. No evidence of fracture. Mild degenerative changes L3-L4. Soft tissues are unremarkable . IMPRESSION: No acute identified.  Mild degenerative changes L3-L4. Electronically Signed   By: Maisie Fushomas  Register   On: 03/05/2016 16:30   Assessment & Plan:  1. Acute bilateral low back pain with sciatica, sciatica laterality unspecified - DG Lumbar Spine Complete  Plan: Prednisone 20 mg,  3 pills for 3 days, 2 pills for 3 days, and  1 pill for 3 days.    Tramadol 50 mg every 8 hours needed for pain.  Godfrey PickKimberly S. Tiburcio PeaHarris, MSN, FNP-C [facility]

## 2016-03-12 ENCOUNTER — Telehealth: Payer: Self-pay

## 2016-03-19 ENCOUNTER — Other Ambulatory Visit: Payer: Self-pay | Admitting: Family Medicine

## 2016-03-19 ENCOUNTER — Telehealth: Payer: Self-pay | Admitting: *Deleted

## 2016-03-19 MED ORDER — GABAPENTIN 100 MG PO CAPS: 100.0000 mg | ORAL_CAPSULE | Freq: Three times a day (TID) | ORAL | 1 refills | Status: DC

## 2016-03-19 NOTE — Telephone Encounter (Signed)
Patient came in and wanted refill for prednisone.  Per Selena BattenKim she will fill gabapentin and patient is to call in a week if not better for a referral.  Patient notified.

## 2016-03-19 NOTE — Progress Notes (Signed)
Ordered Gabapentin 100-200 mg up to 3 times daily for sciatica pain. If pain doesn't resolve will refer patient to orthopedics for further evaluation.  Travis PickKimberly S. Tiburcio PeaHarris, MSN, FNP-C [facility]

## 2016-03-26 ENCOUNTER — Ambulatory Visit (INDEPENDENT_AMBULATORY_CARE_PROVIDER_SITE_OTHER): Payer: 59

## 2016-03-26 ENCOUNTER — Ambulatory Visit (INDEPENDENT_AMBULATORY_CARE_PROVIDER_SITE_OTHER): Payer: 59 | Admitting: Family Medicine

## 2016-03-26 VITALS — BP 122/72 | HR 69 | Temp 98.2°F | Resp 17 | Ht 65.0 in | Wt 196.0 lb

## 2016-03-26 DIAGNOSIS — M79651 Pain in right thigh: Secondary | ICD-10-CM

## 2016-03-26 MED ORDER — GABAPENTIN 300 MG PO CAPS: 300.0000 mg | ORAL_CAPSULE | Freq: Three times a day (TID) | ORAL | 1 refills | Status: DC

## 2016-03-26 MED ORDER — PREDNISONE 20 MG PO TABS: ORAL_TABLET | ORAL | 0 refills | Status: DC

## 2016-03-26 MED ORDER — MELOXICAM 15 MG PO TABS: 15.0000 mg | ORAL_TABLET | Freq: Every day | ORAL | 1 refills | Status: DC

## 2016-03-26 NOTE — Patient Instructions (Addendum)
Dr. Delbert HarnessMurphy Wainer , appointment today at 1:15 pm  [address] [place], [place]  [phone]   Take Meloxicam 15 mg once daily for inflammation  Gabapentin 300 mg twice daily and at bedtime increase dose to 600 mg.    IF you received an x-ray today, you will receive an invoice from [facility]. Please contact [facility] at [phone] with questions or concerns regarding your invoice.   IF you received labwork today, you will receive an invoice from [facility]. Please contact [facility] at [phone] with questions or concerns regarding your invoice.   Our billing staff will not be able to assist you with questions regarding bills from these companies.  You will be contacted with the lab results as soon as they are available. The fastest way to get your results is to activate your My Chart account. Instructions are located on the last page of this paperwork. If you have not heard from us regarding the results in 2 weeks, please contact this office.

## 2016-03-26 NOTE — Progress Notes (Signed)
Patient ID: Rhina BrackettRene Kotas, male    DOB: 06/27/1985, 30 y.o.   MRN: [id number]  PCP: Joaquin CourtsKimberly Willadene Mounsey, FNP  Chief Complaint  Patient presents with  . Leg Pain    right thigh area     Subjective:   HPI  30 year old male presents for evaluation of right thigh pain times 4 weeks. He was originally seen in the clinic on 03/05/2016 and evaluated and treated for low back pain with sciatica. He received a prednisone taper which improved low back pain but sciatica of right leg has persisted. He describes a shooting pain which is worst when lying down and sitting. Describes periodic episodes of numbness and tingling in his right foot. He was prescribed Gabapentin helped slightly but wears off before next dose is scheduled. Reports pain at it worst is 10/10. Feels like right leg is increasingly weak.  Social History   Social History  . Marital status: Married    Spouse name: N/A  . Number of children: N/A  . Years of education: N/A   Occupational History  . Not on file.   Social History Main Topics  . Smoking status: Never Smoker  . Smokeless tobacco: Never Used  . Alcohol use Yes  . Drug use: No  . Sexual activity: Not on file   Other Topics Concern  . Not on file   Social History Narrative  . No narrative on file   Family History  Problem Relation Age of Onset  . Diabetes Mother   . Hypertension Sister    Review of Systems See HPI There are no active problems to display for this patient.    Prior to Admission medications   Medication Sig Start Date End Date Taking? Authorizing Provider  gabapentin (NEURONTIN) 100 MG capsule Take 1-2 capsules (100-200 mg total) by mouth 3 (three) times daily. 03/19/16  Yes Doyle AskewKimberly Stephenia Macee Venables, FNP  predniSONE (DELTASONE) 20 MG tablet Take 3 PO QAM x3days, 2 PO QAM x3days, 1 PO QAM x3days 03/05/16  Yes Doyle AskewKimberly Stephenia Danile Trier, FNP  traMADol (ULTRAM) 50 MG tablet Take 1 tablet (50 mg total) by mouth every 8 (eight) hours as  needed. 03/05/16  Yes Doyle AskewKimberly Stephenia Jaimie Pippins, FNP     No Known Allergies     Objective:  Physical Exam  Constitutional: He is oriented to person, place, and time. He appears well-developed and well-nourished.  Cardiovascular: Normal rate, regular rhythm, normal heart sounds and intact distal pulses.   Pulmonary/Chest: Effort normal and breath sounds normal.  Musculoskeletal: He exhibits tenderness.  Increase pain with right leg extension Increase pain  Limited flexion of right knee  Increase pain with plantar and dorisflexion  Neurological: He is alert and oriented to person, place, and time.  Skin: Skin is warm and dry.  Psychiatric: He has a normal mood and affect. His behavior is normal. Judgment and thought content normal.   Vitals:   03/26/16 1056  BP: 122/72  Pulse: 69  Resp: 17  Temp: 98.2 F (36.8 C)  Dg Lumbar Spine Complete  Result Date: 03/05/2016 CLINICAL DATA:  Low back pain with sciatica. EXAM: LUMBAR SPINE - COMPLETE 4+ VIEW COMPARISON:  No recent prior. FINDINGS: No acute bony abnormality identified. Normal alignment and mineralization. No evidence of fracture. Mild degenerative changes L3-L4. Soft tissues are unremarkable . IMPRESSION: No acute identified.  Mild degenerative changes L3-L4. Electronically Signed   By: Maisie Fushomas  Register   On: 03/05/2016 16:30   Dg Tibia/fibula Right  Result Date: 03/26/2016  CLINICAL DATA:  Four weeks of right thigh pain worse when lying down and sitting. EXAM: RIGHT TIBIA AND FIBULA - 2 VIEW COMPARISON:  None in PACs FINDINGS: The tibia and fibula are adequately mineralized. There is no lytic or blastic lesion. There is no periosteal reaction. The observed portions of the knee and ankle are unremarkable. The soft tissues are normal. IMPRESSION: There is no acute or chronic bony abnormality of the right tibia or fibula. Electronically Signed   By: David  SwazilandJordan M.D.   On: 03/26/2016 12:12   Dg Femur, Min 2 Views Right  Result  Date: 03/26/2016 CLINICAL DATA:  Four weeks of right thigh pain unresponsive to treatment for sciatica. The patient pain is worse when lying down and seeding. EXAM: RIGHT FEMUR 2 VIEWS COMPARISON:  None in PACs FINDINGS: The right femur is adequately mineralized. There is no lytic nor blastic lesion. There is no periosteal reaction. The observed portions of the hip and knee joints are unremarkable. 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 The soft tissues of the thigh are unremarkable. IMPRESSION: There is no acute bony abnormality of the right femur. 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 Electronically Signed   By: David  SwazilandJordan M.D.   On: 03/26/2016 12:13    Assessment & Plan:  1. Right thigh pain - AMB referral to orthopedics - DG Tibia/Fibula Right - DG FEMUR, MIN 2 VIEWS RIGHT Plan: -Take Meloxicam 15 mg once daily. -Continue Gabapentin 300 mg twice daily and increase bedtime dose to 600 mg.  Patient was able to be re-evaluated emergently today at Dr. Janeece FittingMurphy Wainer's office. He is to follow as needed.  Godfrey PickKimberly S. Tiburcio PeaHarris, MSN, FNP-C [facility]

## 2016-04-02 IMAGING — CR DG ELBOW 2V*R*
2 series · 2 of 2 positions shown · non-contrast
Comparison: None.

CLINICAL DATA: Injury.  Elbow pain.

EXAM:
RIGHT ELBOW - 2 VIEW

[AP]
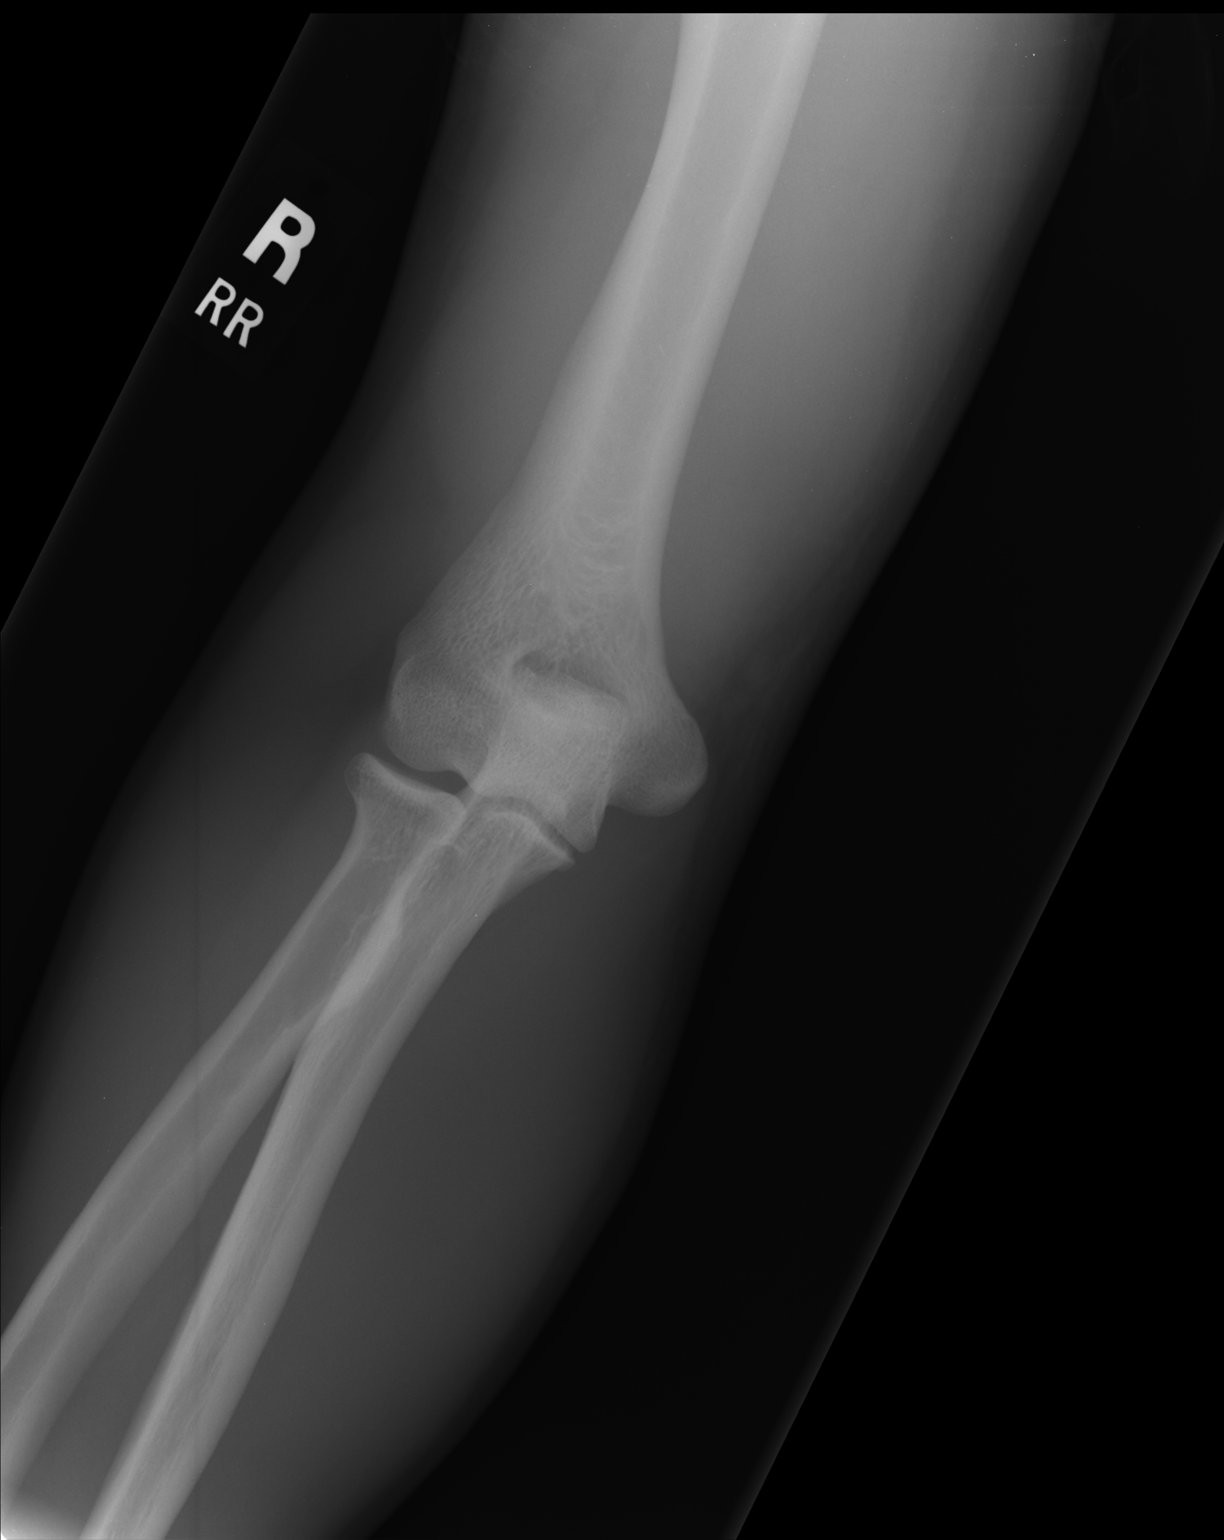

[lateral]
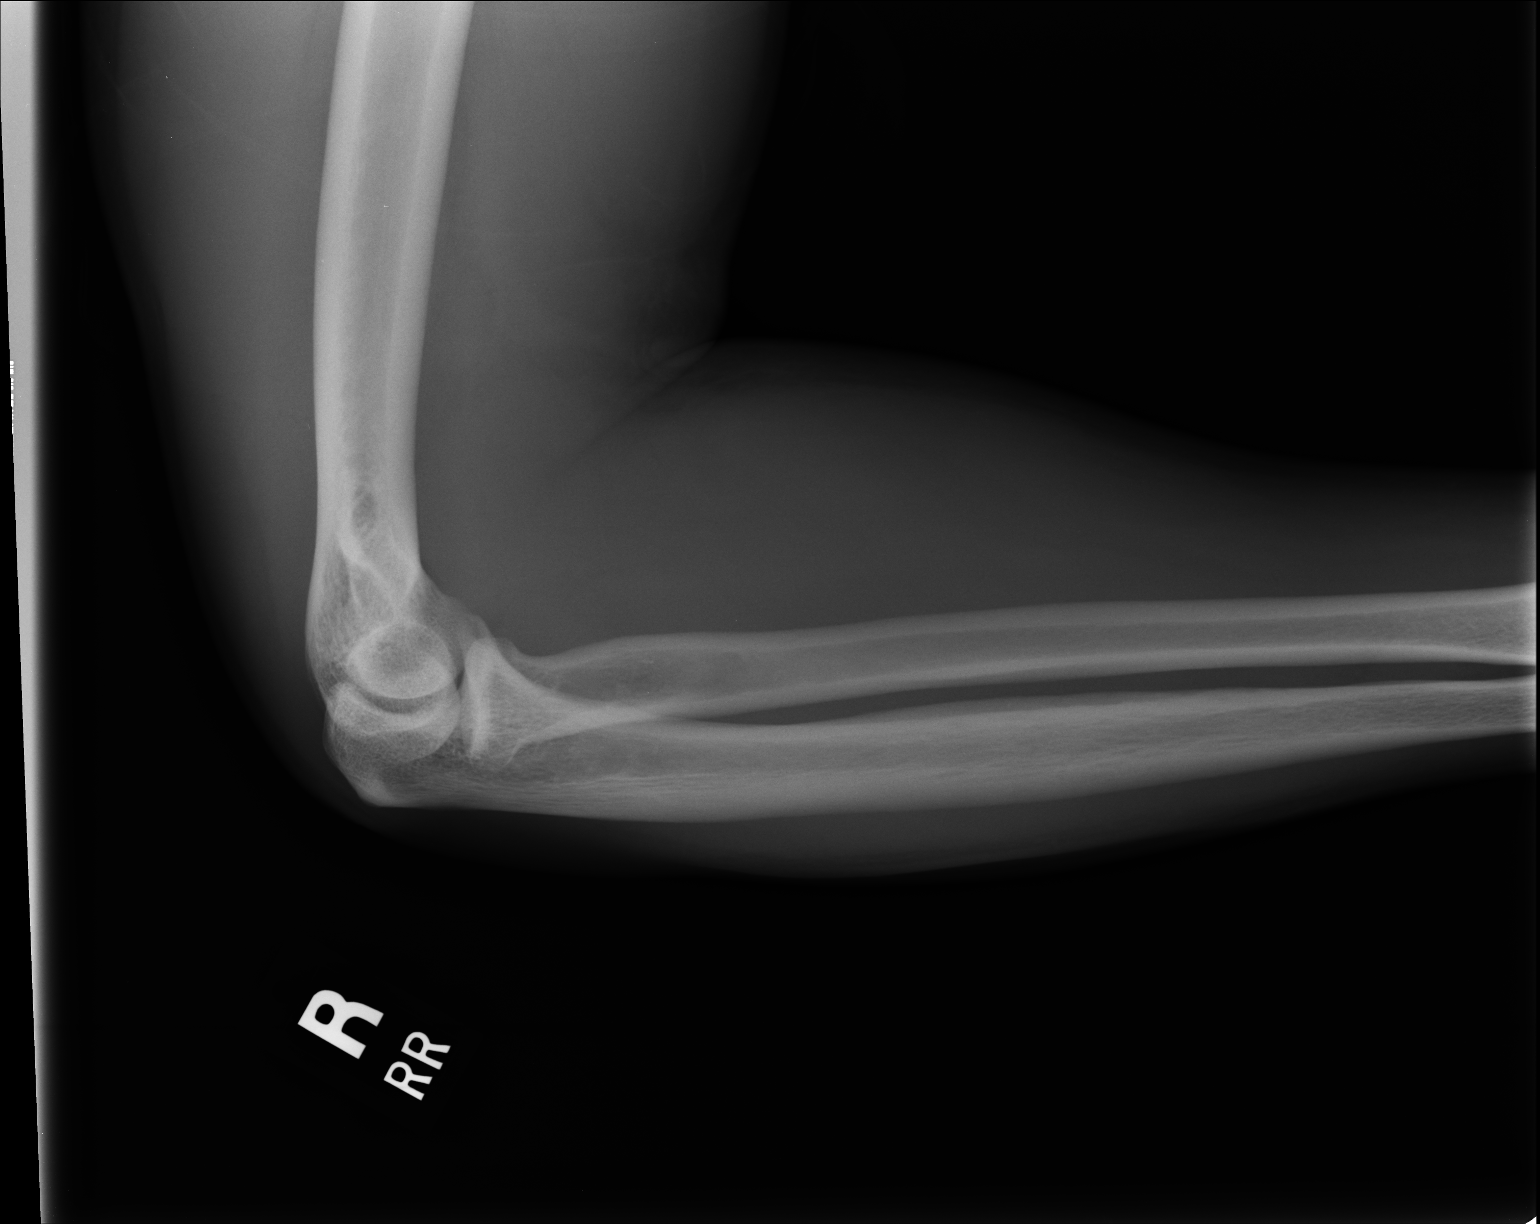

[2 of 2 positions shown; findings below may reference images not displayed]

FINDINGS: There is no evidence of fracture, dislocation, or joint effusion.
There is no evidence of arthropathy or other focal bone abnormality.
Soft tissues are unremarkable.
IMPRESSION: Negative.

## 2016-04-10 ENCOUNTER — Ambulatory Visit (INDEPENDENT_AMBULATORY_CARE_PROVIDER_SITE_OTHER): Payer: 59 | Admitting: Orthopaedic Surgery

## 2016-04-10 ENCOUNTER — Ambulatory Visit (INDEPENDENT_AMBULATORY_CARE_PROVIDER_SITE_OTHER): Payer: 59

## 2016-04-10 ENCOUNTER — Ambulatory Visit (INDEPENDENT_AMBULATORY_CARE_PROVIDER_SITE_OTHER): Payer: 59 | Admitting: Urgent Care

## 2016-04-10 VITALS — BP 120/84 | HR 85 | Temp 98.6°F | Resp 17 | Ht 65.0 in | Wt 191.0 lb

## 2016-04-10 DIAGNOSIS — R52 Pain, unspecified: Secondary | ICD-10-CM

## 2016-04-10 DIAGNOSIS — R0789 Other chest pain: Secondary | ICD-10-CM

## 2016-04-10 DIAGNOSIS — R0981 Nasal congestion: Secondary | ICD-10-CM

## 2016-04-10 DIAGNOSIS — J029 Acute pharyngitis, unspecified: Secondary | ICD-10-CM

## 2016-04-10 DIAGNOSIS — R05 Cough: Secondary | ICD-10-CM

## 2016-04-10 DIAGNOSIS — H938X1 Other specified disorders of right ear: Secondary | ICD-10-CM | POA: Diagnosis not present

## 2016-04-10 LAB — POCT CBC
Granulocyte percent: 72.5 %G (ref 37–80)
HCT, POC: 42.1 % — AB (ref 43.5–53.7)
Hemoglobin: 14.9 g/dL (ref 14.1–18.1)
Lymph, poc: 2.6 (ref 0.6–3.4)
MCH, POC: 31.2 pg (ref 27–31.2)
MCHC: 35.5 g/dL — AB (ref 31.8–35.4)
MCV: 87.9 fL (ref 80–97)
MID (cbc): 1.1 — AB (ref 0–0.9)
MPV: 8.1 fL (ref 0–99.8)
POC Granulocyte: 9.9 — AB (ref 2–6.9)
POC LYMPH PERCENT: 19.2 %L (ref 10–50)
POC MID %: 8.3 %M (ref 0–12)
Platelet Count, POC: 237 10*3/uL (ref 142–424)
RBC: 4.79 M/uL (ref 4.69–6.13)
RDW, POC: 12.7 %
WBC: 13.6 10*3/uL — AB (ref 4.6–10.2)

## 2016-04-10 LAB — POCT INFLUENZA A/B
Influenza A, POC: NEGATIVE
Influenza B, POC: NEGATIVE

## 2016-04-10 MED ORDER — PSEUDOEPHEDRINE HCL ER 120 MG PO TB12: 120.0000 mg | ORAL_TABLET | Freq: Two times a day (BID) | ORAL | 3 refills | Status: DC

## 2016-04-10 MED ORDER — BENZONATATE 100 MG PO CAPS: 100.0000 mg | ORAL_CAPSULE | Freq: Three times a day (TID) | ORAL | 0 refills | Status: DC | PRN

## 2016-04-10 MED ORDER — AZITHROMYCIN 250 MG PO TABS: ORAL_TABLET | ORAL | 0 refills | Status: DC

## 2016-04-10 MED ORDER — HYDROCODONE-HOMATROPINE 5-1.5 MG/5ML PO SYRP: 5.0000 mL | ORAL_SOLUTION | Freq: Every evening | ORAL | 0 refills | Status: DC | PRN

## 2016-04-10 NOTE — Patient Instructions (Addendum)
Bronquitis aguda (Acute Bronchitis) La bronquitis es una inflamacin de las vas respiratorias que se extienden desde la trquea Lubrizol Corporationhasta los pulmones (bronquios). La inflamacin produce la formacin de mucosidad. Esto produce tos, que es el sntoma ms frecuente de la bronquitis.  Cuando la bronquitis es [place]uda, generalmente comienza de Woodlawnmanera sbita y desaparece luego de un par de semanas. El hbito de fumar, las alergias y el asma pueden empeorar la bronquitis. Los episodios repetidos de bronquitis pueden causar ms problemas pulmonares.  CAUSAS La causa ms frecuente de bronquitis aguda es el mismo virus que produce el resfro. El virus puede propagarse de Neomia Dearuna persona a la otra (contagioso) a travs de la tos y los estornudos, y al tocar objetos contaminados. SIGNOS Y SNTOMAS   Tos.  Grant RutsFiebre.  Tos con mucosidad.  Dolores PepsiCoen el cuerpo.  Congestin en el pecho.  Escalofros.  Falta de aire.  Dolor de Advertising copywritergarganta. DIAGNSTICO  La bronquitis aguda en general se diagnostica con un examen fsico. El mdico tambin le har preguntas sobre su historia clnica. En algunos casos se indican otros estudios, como radiografas, para Risk managerdescartar otras enfermedades.  TRATAMIENTO  La bronquitis aguda generalmente desaparece en un par de semanas. Con frecuencia, no es Quarry managernecesario realizar un tratamiento. Los medicamentos se indican para aliviar la fiebre o la tos. Generalmente, no es necesario el uso de antibiticos, pero pueden recetarse en ciertas ocasiones. En algunos casos, se recomienda el uso de un inhalador para mejorar la falta de aire y Scientist, physiologicalcontrolar la tos. Un vaporizador de aire fro podr ayudarlo a MeadWestvacodisolver las secreciones bronquiales y Statisticianfacilitar su eliminacin.  INSTRUCCIONES PARA EL CUIDADO EN EL HOGAR  Descanse lo suficiente.  Beba lquidos en abundancia para mantener la orina de color claro o amarillo plido (excepto que padezca una enfermedad que requiera la restriccin de lquidos). El aumento  de lquidos puede ayudar a que las secreciones respiratorias (esputo) sean menos espesas y a reducir la congestin del pecho, y Transport plannerevitar la deshidratacin.  Tome los medicamentos solamente como se lo haya indicado el mdico.  Si le recetaron antibiticos, asegrese de terminarlos, incluso si comienza a sentirse mejor.  Evite fumar o aspirar el humo de otros fumadores. La exposicin al humo del cigarrillo o a irritantes qumicos har que la bronquitis empeore. Si fuma, considere el uso de goma de Theatre managermascar o la aplicacin de parches en la piel que contengan nicotina para Paramedicaliviar los sntomas de abstinencia. Si deja de fumar, sus pulmones se curarn ms rpido.  Reduzca la probabilidad de otro episodio de bronquitis aguda lavando sus manos con frecuencia, evitando a las personas que tengan sntomas y tratando de no tocarse las manos con la boca, la nariz o los ojos.  Concurra a todas las visitas de control como se lo haya indicado el mdico. SOLICITE ATENCIN MDICA SI: Los sntomas no mejoran despus de una semana de Burfordvilletratamiento.  SOLICITE ATENCIN MDICA DE INMEDIATO SI:  Comienza a tener fiebre o escalofros cada vez ms intensos.  Siente dolor en el pecho.  Le falta el aire de manera preocupante.  La flema tiene Homewoodsangre.  Se deshidrata.  Se desmaya o siente que va a desmayarse de forma repetida.  Tiene vmitos que se repiten.  Tiene un dolor de cabeza intenso. ASEGRESE DE QUE:   Comprende estas instrucciones.  Controlar su afeccin.  Recibir ayuda de inmediato si no mejora o si empeora. Esta informacin no tiene Theme park managercomo fin reemplazar el consejo del mdico. Asegrese de hacerle al mdico cualquier pregunta que  tenga. Document Released: 05/07/2005 Document Revised: 05/28/2014 Elsevier Interactive Patient Education  2017 [employer]     IF you received an x-ray today, you will receive an invoice from [facility]. Please contact [facility] at [phone]  with questions or concerns regarding your invoice.   IF you received labwork today, you will receive an invoice from [facility]. Please contact [facility] at [phone] with questions or concerns regarding your invoice.   Our billing staff will not be able to assist you with questions regarding bills from these companies.  You will be contacted with the lab results as soon as they are available. The fastest way to get your results is to activate your My Chart account. Instructions are located on the last page of this paperwork. If you have not heard from us regarding the results in 2 weeks, please contact this office.

## 2016-04-10 NOTE — Progress Notes (Signed)
MRN: [id number] DOB: 03/31/1986  Subjective:   Travis Estrada is a 30 y.o. male presenting for chief complaint of Cough (and headache sincue sunday )  Reports 3 day history of productive cough, subjective fever, chills. Cough elicits headache, chest pain. Also has sinus congestion and pain, sore throat, body ache, right ear fullness. Denies shob, wheezing, n/v, abdominal pain, rashes. His wife has very similar symptoms developed over same time frame. Denies smoking cigarettes.  Travis Estrada has a current medication list which includes the following prescription(s): gabapentin, meloxicam, and tramadol. Also has No Known Allergies.  Travis Estrada  has a past medical history of Pterygium. Also  has no past surgical history on file.   Objective:   Vitals: BP 120/84 (BP Location: Left Arm, Patient Position: Sitting, Cuff Size: Large)   Pulse 85   Temp 98.6 F (37 C) (Oral)   Resp 17   Ht 5\' 5"  (1.651 m)   Wt 191 lb (86.6 kg)   SpO2 98%   BMI 31.78 kg/m   Physical Exam  Constitutional: He is oriented to person, place, and time. He appears well-developed and well-nourished.  HENT:  Right TM slightly injected. Both TM's intact bilaterally but no effusions. Nasal turbinates erythematous, edematous without sinus tenderness. Nasal passages minimally patent, R>L. Throat without oropharyngeal exudates, erythema or abscesses.   Eyes: Right eye exhibits no discharge. Left eye exhibits no discharge. No scleral icterus.  Neck: Normal range of motion. Neck supple.  Cardiovascular: Normal rate, regular rhythm and intact distal pulses.  Exam reveals no gallop and no friction rub.   No murmur heard. Pulmonary/Chest: No respiratory distress. He has no wheezes. He has no rales.  Lymphadenopathy:    He has cervical adenopathy (right, superior).  Neurological: He is alert and oriented to person, place, and time.   Results for orders placed or performed in visit on 04/10/16 (from the past 24 hour(s))  POCT CBC      Status: Abnormal   Collection Time: 04/10/16  3:22 PM  Result Value Ref Range   WBC 13.6 (A) 4.6 - 10.2 K/uL   Lymph, poc 2.6 0.6 - 3.4   POC LYMPH PERCENT 19.2 10 - 50 %L   MID (cbc) 1.1 (A) 0 - 0.9   POC MID % 8.3 0 - 12 %M   POC Granulocyte 9.9 (A) 2 - 6.9   Granulocyte percent 72.5 37 - 80 %G   RBC 4.79 4.69 - 6.13 M/uL   Hemoglobin 14.9 14.1 - 18.1 g/dL   HCT, POC 40.942.1 (A) 81.143.5 - 53.7 %   MCV 87.9 80 - 97 fL   MCH, POC 31.2 27 - 31.2 pg   MCHC 35.5 (A) 31.8 - 35.4 g/dL   RDW, POC 91.412.7 %   Platelet Count, POC 237 142 - 424 K/uL   MPV 8.1 0 - 99.8 fL  POCT Influenza A/B     Status: None   Collection Time: 04/10/16  3:30 PM  Result Value Ref Range   Influenza A, POC Negative Negative   Influenza B, POC Negative Negative   Dg Chest 2 View  Result Date: 04/10/2016 CLINICAL DATA:  Cough, shortness of breath for 4 days, leukocytosis, atypical chest pain, difficulty breathing EXAM: CHEST  2 VIEW COMPARISON:  None FINDINGS: Normal heart size, mediastinal contours, and pulmonary vascularity. Mild peribronchial thickening. No pulmonary infiltrate, pleural effusion, or pneumothorax. Bones unremarkable. IMPRESSION: Bronchitic changes without infiltrate. Electronically Signed   By: Ulyses SouthwardMark  Boles M.D.   On: 04/10/2016  15:50    Assessment and Plan :   1. Cough 2. Atypical chest pain 3. Sore throat 4. Body aches 5. Sensation of fullness in right ear 6. Sinus congestion - Will cover for bronchitis with azithromycin, cough suppression. Supportive care advised otherwise. RTC in 1 week if no improvement.  Wallis BambergMario Georgiann Neider, PA-C [facility] [phone] 04/10/2016 2:51 PM

## 2016-07-10 ENCOUNTER — Encounter (HOSPITAL_COMMUNITY): Payer: Self-pay

## 2016-07-10 ENCOUNTER — Emergency Department (HOSPITAL_COMMUNITY)
Admission: EM | Admit: 2016-07-10 | Discharge: 2016-07-10 | Disposition: A | Discharge status: Home / Self Care | Payer: 59 | Attending: Emergency Medicine | Admitting: Emergency Medicine

## 2016-07-10 DIAGNOSIS — R112 Nausea with vomiting, unspecified: Secondary | ICD-10-CM | POA: Insufficient documentation

## 2016-07-10 DIAGNOSIS — R10816 Epigastric abdominal tenderness: Secondary | ICD-10-CM | POA: Insufficient documentation

## 2016-07-10 LAB — CBC WITH DIFFERENTIAL/PLATELET
Basophils Absolute: 0 10*3/uL (ref 0.0–0.1)
Basophils Relative: 0 %
Eosinophils Absolute: 0.1 10*3/uL (ref 0.0–0.7)
Eosinophils Relative: 0 %
HCT: 42 % (ref 39.0–52.0)
Hemoglobin: 14.7 g/dL (ref 13.0–17.0)
Lymphocytes Relative: 12 %
Lymphs Abs: 1.4 10*3/uL (ref 0.7–4.0)
MCH: 29.9 pg (ref 26.0–34.0)
MCHC: 35 g/dL (ref 30.0–36.0)
MCV: 85.4 fL (ref 78.0–100.0)
Monocytes Absolute: 0.6 10*3/uL (ref 0.1–1.0)
Monocytes Relative: 5 %
Neutro Abs: 9.5 10*3/uL — ABNORMAL HIGH (ref 1.7–7.7)
Neutrophils Relative %: 83 %
Platelets: 204 10*3/uL (ref 150–400)
RBC: 4.92 MIL/uL (ref 4.22–5.81)
RDW: 13.5 % (ref 11.5–15.5)
WBC: 11.6 10*3/uL — ABNORMAL HIGH (ref 4.0–10.5)

## 2016-07-10 LAB — COMPREHENSIVE METABOLIC PANEL
ALT: 34 U/L (ref 17–63)
AST: 29 U/L (ref 15–41)
Albumin: 4.2 g/dL (ref 3.5–5.0)
Alkaline Phosphatase: 60 U/L (ref 38–126)
Anion gap: 11 (ref 5–15)
BUN: 12 mg/dL (ref 6–20)
CO2: 25 mmol/L (ref 22–32)
Calcium: 8.9 mg/dL (ref 8.9–10.3)
Chloride: 102 mmol/L (ref 101–111)
Creatinine, Ser: 0.96 mg/dL (ref 0.61–1.24)
GFR calc Af Amer: >60 mL/min (ref >60)
GFR calc non Af Amer: >60 mL/min (ref >60)
Glucose, Bld: 109 mg/dL — ABNORMAL HIGH (ref 65–99)
Potassium: 3.4 mmol/L — ABNORMAL LOW (ref 3.5–5.1)
Sodium: 138 mmol/L (ref 135–145)
Total Bilirubin: 1.2 mg/dL (ref 0.3–1.2)
Total Protein: 6.9 g/dL (ref 6.5–8.1)

## 2016-07-10 MED ORDER — ONDANSETRON HCL 4 MG PO TABS: 4.0000 mg | ORAL_TABLET | Freq: Three times a day (TID) | ORAL | 0 refills | Status: DC | PRN

## 2016-07-10 MED ORDER — ONDANSETRON HCL 4 MG/2ML IJ SOLN
4.0000 mg | Freq: Once | INTRAMUSCULAR | Status: AC
  Administered 2016-07-10: 4 mg via INTRAVENOUS
  Filled 2016-07-10: qty 2

## 2016-07-10 MED ORDER — PANTOPRAZOLE SODIUM 40 MG IV SOLR
40.0000 mg | Freq: Once | INTRAVENOUS | Status: AC
  Administered 2016-07-10: 40 mg via INTRAVENOUS
  Filled 2016-07-10: qty 40

## 2016-07-10 MED ORDER — SODIUM CHLORIDE 0.9 % IV BOLUS (SEPSIS)
1000.0000 mL | Freq: Once | INTRAVENOUS | Status: AC
  Administered 2016-07-10: 1000 mL via INTRAVENOUS

## 2016-07-10 NOTE — ED Provider Notes (Signed)
[facility]-EMERGENCY DEPT Provider Note   CSN: [id number] Arrival date & time: 07/10/16  [id number]  By signing my name below, I, Sonum Patel, attest that this documentation has been prepared under the direction and in the presence of Raeford RazorStephen Dyan Labarbera, MD. Electronically Signed: Sonum Patel, Neurosurgeoncribe. 07/10/16. 9:26 AM.  History   Chief Complaint Chief Complaint  Patient presents with  . Emesis    The history is provided by the patient. No language interpreter was used.    HPI Comments: Travis Estrada is a 31 y.o. male who presents to the Emergency Department complaining of persistent nausea and vomiting that began last night. He reports intermittent generalized abdominal pain and 1 episode of diarrhea this morning. He reports drinking 1 beer last night; states he hasn't had alcohol in a few months and does not regularly drink. He also reports taking an unspecified pain pill yesterday for ankle pain. He states he typically does not take any anti-inflammatories on a regular basis. He denies fever. He report potential sick contacts at work. He denies history of pancreatitis.   Past Medical History:  Diagnosis Date  . Pterygium     There are no active problems to display for this patient.   History reviewed. No pertinent surgical history.     Home Medications    Prior to Admission medications   Medication Sig Start Date End Date Taking? Authorizing Provider  azithromycin (ZITHROMAX) 250 MG tablet Start with 2 tablets today, then 1 daily thereafter. 04/10/16   Wallis BambergMario Mani, PA-C  benzonatate (TESSALON) 100 MG capsule Take 1-2 capsules (100-200 mg total) by mouth 3 (three) times daily as needed for cough. 04/10/16   Wallis BambergMario Mani, PA-C  gabapentin (NEURONTIN) 300 MG capsule Take 1 capsule (300 mg total) by mouth 3 (three) times daily. 03/26/16   Doyle AskewKimberly Stephenia Harris, FNP  HYDROcodone-homatropine [facility](HYCODAN) 5-1.5 MG/5ML syrup Take 5 mLs by mouth at bedtime as needed. 04/10/16   Wallis BambergMario Mani, PA-C    meloxicam (MOBIC) 15 MG tablet Take 1 tablet (15 mg total) by mouth daily. 03/26/16   Doyle AskewKimberly Stephenia Harris, FNP  pseudoephedrine (SUDAFED 12 HOUR) 120 MG 12 hr tablet Take 1 tablet (120 mg total) by mouth 2 (two) times daily. 04/10/16   Wallis BambergMario Mani, PA-C  traMADol (ULTRAM) 50 MG tablet Take 1 tablet (50 mg total) by mouth every 8 (eight) hours as needed. 03/05/16   Doyle AskewKimberly Stephenia Harris, FNP    Family History Family History  Problem Relation Age of Onset  . Diabetes Mother   . Hypertension Sister     Social History Social History  Substance Use Topics  . Smoking status: Never Smoker  . Smokeless tobacco: Never Used  . Alcohol use Yes     Allergies   Patient has no known allergies.   Review of Systems Review of Systems  A complete 10 system review of systems was obtained and all systems are negative except as noted in the HPI and PMH.   Physical Exam Updated Vital Signs BP 115/70 (BP Location: Right Arm)   Pulse 83   Temp 98.3 F (36.8 C) (Oral)   Resp 16   Ht 5\' 5"  (1.651 m)   Wt 186 lb (84.4 kg)   SpO2 97%   BMI 30.95 kg/m   Physical Exam  Constitutional: He is oriented to person, place, and time. He appears well-developed and well-nourished.  HENT:  Head: Normocephalic and atraumatic.  Eyes: EOM are normal.  Neck: Normal range of motion.  Cardiovascular: Normal rate,  regular rhythm, normal heart sounds and intact distal pulses.   Pulmonary/Chest: Effort normal and breath sounds normal. No respiratory distress.  Abdominal: Soft. He exhibits no distension. There is tenderness (epigastric and LLQ).  Musculoskeletal: Normal range of motion.  Neurological: He is alert and oriented to person, place, and time.  Skin: Skin is warm and dry.  Psychiatric: He has a normal mood and affect. Judgment normal.  Nursing note and vitals reviewed.    ED Treatments / Results  DIAGNOSTIC STUDIES: Oxygen Saturation is 97% on RA, normal by my interpretation.     COORDINATION OF CARE: 9:24 AM Discussed treatment plan with pt at bedside and pt agreed to plan.   Labs (all labs ordered are listed, but only abnormal results are displayed) Labs Reviewed  CBC WITH DIFFERENTIAL/PLATELET - Abnormal; Notable for the following:       Result Value   WBC 11.6 (*)    Neutro Abs 9.5 (*)    All other components within normal limits  COMPREHENSIVE METABOLIC PANEL - Abnormal; Notable for the following:    Potassium 3.4 (*)    Glucose, Bld 109 (*)    All other components within normal limits    EKG  EKG Interpretation None       Radiology No results found.  Procedures Procedures (including critical care time)  Medications Ordered in ED Medications - No data to display   Initial Impression / Assessment and Plan / ED Course  I have reviewed the triage vital signs and the nursing notes.  Pertinent labs & imaging results that were available during my care of the patient were reviewed by me and considered in my medical decision making (see chart for details).      Final Clinical Impressions(s) / ED Diagnoses   Final diagnoses:  Nausea and vomiting, intractability of vomiting not specified, unspecified vomiting type    New Prescriptions New Prescriptions   No medications on file   I personally preformed the services scribed in my presence. The recorded information has been reviewed is accurate. Raeford Razor, MD.    Raeford Razor, MD 07/26/16 [phone]

## 2016-07-10 NOTE — ED Triage Notes (Signed)
Pt from home with  vomiting since last night at 9pm. No fever or headache

## 2016-07-18 NOTE — Telephone Encounter (Signed)
Error

## 2016-08-13 ENCOUNTER — Ambulatory Visit (INDEPENDENT_AMBULATORY_CARE_PROVIDER_SITE_OTHER): Payer: 59 | Admitting: Urgent Care

## 2016-08-13 VITALS — BP 109/75 | HR 73 | Temp 97.9°F | Resp 18 | Ht 65.0 in | Wt 188.6 lb

## 2016-08-13 DIAGNOSIS — R197 Diarrhea, unspecified: Secondary | ICD-10-CM | POA: Diagnosis not present

## 2016-08-13 DIAGNOSIS — E860 Dehydration: Secondary | ICD-10-CM

## 2016-08-13 DIAGNOSIS — R109 Unspecified abdominal pain: Secondary | ICD-10-CM | POA: Diagnosis not present

## 2016-08-13 LAB — POCT URINALYSIS DIP (MANUAL ENTRY)
Bilirubin, UA: NEGATIVE
Blood, UA: NEGATIVE
Glucose, UA: NEGATIVE
Ketones, POC UA: NEGATIVE
Leukocytes, UA: NEGATIVE
Nitrite, UA: NEGATIVE
Spec Grav, UA: 1.02 (ref 1.030–1.035)
Urobilinogen, UA: 0.2 (ref ?–2.0)
pH, UA: 7 (ref 5.0–8.0)

## 2016-08-13 MED ORDER — LOPERAMIDE HCL 2 MG PO CAPS: ORAL_CAPSULE | ORAL | 0 refills | Status: DC

## 2016-08-13 MED ORDER — ONDANSETRON 8 MG PO TBDP: 8.0000 mg | ORAL_TABLET | Freq: Three times a day (TID) | ORAL | 0 refills | Status: DC | PRN

## 2016-08-13 NOTE — Patient Instructions (Addendum)
Gastroenteritis viral en los adultos (Viral Gastroenteritis, Adult) La gastroenteritis viral tambin se conoce como gripe estomacal. La causa de esta afeccin son diversos virus. Estos virus puede transmitirse de una persona a otra con mucha facilidad (son sumamente contagiosos). Esta afeccin podra afectar el estmago, el intestino delgado y el intestino grueso. Puede causar diarrea lquida, fiebre y vmitos repentinos. La diarrea y los vmitos pueden hacerlo sentir dbil y causar deshidratacin. Es posible que no pueda retener los lquidos. La deshidratacin puede hacerlo sentir cansado y sediento, producirle sequedad en la boca y disminuir la frecuencia con la que orina. Los adultos mayores y las personas que tienen otras enfermedades o un sistema inmunitario dbil estn en mayor riesgo de deshidratacin. Es importante restituir los lquidos que pierde por causa de la diarrea y los vmitos. Si se deshidrata gravemente, podra tener que recibir lquidos a travs de una va intravenosa(IV). CAUSAS La gastroenteritis es causada por diversos virus, entre los que se incluyen el rotavirus y el norovirus. El norovirus es la causa ms frecuente en los adultos. Puede contraerlo a travs de la ingesta de alimentos o agua contaminados, o por tocar superficies contaminadas con alguno de estos virus. Tambin puede enfermarse al compartir utensilios u otros artculos personales con una persona infectada. FACTORES DE RIESGO Es ms probable que esta afeccin se manifieste en las personas que:  Tiene un sistema de defensa (sistema inmunitario) dbil.  Viven con uno o ms nios menores de 2aos.  Viven en hogares de ancianos.  Viajan en cruceros. SNTOMAS Los sntomas de esta afeccin suelen aparecer entre 1 y 2das despus de la exposicin al virus. Pueden durar varios das o incluso una semana. Los sntomas ms frecuentes son diarrea lquida y vmitos. Otros sntomas pueden ser los  siguientes:  Fiebre.  Dolor de cabeza.  Fatiga.  Dolor en el abdomen.  Escalofros.  Debilidad.  Nuseas.  Dolores musculares.  Prdida del apetito. DIAGNSTICO Esta afeccin se diagnostica mediante sus antecedentes mdicos y un examen fsico. Tambin podran hacerle un anlisis de materia fecal para detectar virus u otras infecciones. TRATAMIENTO Por lo general, esta afeccin desaparece por s sola. El tratamiento se centra en la restitucin de los lquidos perdidos (rehidratacin). El mdico podra recomendarle que tome una solucin de rehidratacin oral (SRO) para reemplazar sales y minerales (electrolitos) importantes en el cuerpo. En los casos ms graves, podra ser necesario administrar lquidos a travs de una va intravenosa (VI). El tratamiento tambin podra incluir medicamentos para aliviar los sntomas. INSTRUCCIONES PARA EL CUIDADO EN EL HOGAR Siga las indicaciones del mdico sobre cmo debe cuidarse en su casa. Comida y bebida Siga estas recomendaciones como se lo haya indicado el mdico:  Tome una SRO. Esta es una bebida que se vende en farmacias y tiendas.  Beba lquidos claros en pequeas cantidades segn le sea posible. Beba lquidos claros, como agua, cubitos de hielo, jugos de fruta rebajados con agua y bebidas deportivas bajas en caloras.  En la medida en que pueda, consuma alimentos blandos y fciles de digerir en pequeas cantidades. Estos alimentos incluyen bananas, compota de manzana, arroz, carnes magras, tostadas y galletas.  Evite consumir lquidos que contengan mucha azcar o cafena, como bebidas energticas, bebidas deportivas y refrescos.  Evite el alcohol.  Evite los alimentos picantes o grasos. Instrucciones generales  Beba suficiente lquido para mantener la orina clara o de color amarillo plido.  Lvese las manos con frecuencia. Use desinfectante para manos si no dispone de agua y jabn.  Asegrese de que   todas las personas que viven  en su casa se laven bien las manos y con frecuencia.  Tome los medicamentos de venta libre y los recetados solamente como se lo haya indicado el mdico.  Descanse en su casa mientras se recupera.  Controle su afeccin para ver si hay cambios.  Tome un bao caliente para ayudar a disminuir el ardor o el dolor causados por los episodios frecuentes de diarrea.  Concurra a todas las visitas de control como se lo haya indicado el mdico. Esto es importante. SOLICITE ATENCIN MDICA SI:  No puede retener los lquidos.  Los sntomas empeoran.  Aparecen nuevos sntomas.  Se siente mareado o siente que va a desvanecerse.  Tiene calambres musculares.  SOLICITE ATENCIN MDICA DE INMEDIATO SI:  Siente dolor en el pecho.  Se siente muy dbil o se desmaya.  Observa sangre en el vmito.  El vmito se asemeja al poso del caf.  Tiene heces con sangre, de color negro, o con aspecto alquitranado.  Siente un dolor de cabeza intenso, rigidez en el cuello, o ambas cosas.  Tiene una erupcin cutnea.  Tiene dolor intenso, clicos, o meteorismo en el abdomen.  Le cuesta respirar o respira muy rpidamente.  Su corazn late muy rpidamente.  Siente la piel fra y hmeda.  Se siente confundido.  Siente dolor al orinar.  Tiene signos de deshidratacin, por ejemplo: ? Orina oscura, muy escasa o falta de orina. ? Labios agrietados. ? Boca seca. ? Ojos hundidos. ? Somnolencia. ? Debilidad.  Esta informacin no tiene como fin reemplazar el consejo del mdico. Asegrese de hacerle al mdico cualquier pregunta que tenga. Document Released: 05/07/2005 Document Revised: 08/29/2015 Document Reviewed: 01/11/2015 Elsevier Interactive Patient Education  2017 Elsevier Inc.     IF you received an x-ray today, you will receive an invoice from [facility]. Please contact [facility] at [phone] with questions or concerns regarding your invoice.   IF you received  labwork today, you will receive an invoice from [facility]. Please contact [facility] at [phone] with questions or concerns regarding your invoice.   Our billing staff will not be able to assist you with questions regarding bills from these companies.  You will be contacted with the lab results as soon as they are available. The fastest way to get your results is to activate your My Chart account. Instructions are located on the last page of this paperwork. If you have not heard from us regarding the results in 2 weeks, please contact this office.      

## 2016-08-13 NOTE — Progress Notes (Signed)
  MRN: [id number] DOB: 09/04/1985  Subjective:   Travis Estrada is a 31 y.o. male presenting for chief complaint of GI Problem (x 2 days ) and Diarrhea (x 1 day)  Reports 2 day history of belly cramping, diarrhea (>10 BM/day). Denies fever, nausea, vomiting, bloody stools, dysuria, urinary frequency, hematuria, headache, dizziness. He is trying to stay hydrated. He notes that he ate out once over the weekend, also ate pork chops that were not fully cooked. He works doing Youth workermanual labor, has strenuous work Counselling psychologistresponsibilities, does a lot of heavy lifting in fast paced environment. Notes that he has had longstanding intermittent and bilateral flank pain.   Bradly ChrisRene has a current medication list which includes the following prescription(s): gabapentin, meloxicam, ondansetron, pseudoephedrine, and tramadol. Also has No Known Allergies.  Bradly ChrisRene  has a past medical history of Pterygium. Also denies past surgical history.   Objective:   Vitals: BP 109/75   Pulse 73   Temp 97.9 F (36.6 C) (Oral)   Resp 18   Ht 5\' 5"  (1.651 m)   Wt 188 lb 9.6 oz (85.5 kg)   SpO2 98%   BMI 31.38 kg/m   Physical Exam  Constitutional: He is oriented to person, place, and time. He appears well-developed and well-nourished.  Cardiovascular: Normal rate, regular rhythm and intact distal pulses.  Exam reveals no gallop and no friction rub.   No murmur heard. Pulmonary/Chest: No respiratory distress. He has no wheezes. He has no rales.  Abdominal: Soft. Bowel sounds are normal. He exhibits no distension and no mass. There is tenderness (right-sided). There is no guarding.  Mild bilateral CVA tenderness.   Neurological: He is alert and oriented to person, place, and time.   Results for orders placed or performed in visit on 08/13/16 (from the past 24 hour(s))  POCT urinalysis dipstick     Status: Abnormal   Collection Time: 08/13/16  4:38 PM  Result Value Ref Range   Color, UA yellow yellow   Clarity, UA clear clear   Glucose, UA negative negative   Bilirubin, UA negative negative   Ketones, POC UA negative negative   Spec Grav, UA 1.020 1.030 - 1.035   Blood, UA negative negative   pH, UA 7.0 5.0 - 8.0   Protein Ur, POC trace (A) negative   Urobilinogen, UA 0.2 Negative - 2.0   Nitrite, UA Negative Negative   Leukocytes, UA Negative Negative   Assessment and Plan :   1. Diarrhea, unspecified type 2. Bilateral flank pain 3. Dehydration 4. Right sided abdominal pain - Will manage as viral gastroenteritis. Patient to use supportive care with Zofran and loperamide. RTC if fever, bloody stools, n/v, worsening abdominal pain develops. Otherwise, rtc as needed.   Wallis BambergMario Brigitta Pricer, PA-C [facility] [phone] 08/13/2016  4:11 PM

## 2016-08-15 LAB — CMP14+EGFR
ALT: 20 IU/L (ref 0–44)
AST: 20 IU/L (ref 0–40)
Albumin/Globulin Ratio: 1.6 (ref 1.2–2.2)
Albumin: 4.5 g/dL (ref 3.5–5.5)
Alkaline Phosphatase: 66 IU/L (ref 39–117)
BUN/Creatinine Ratio: 13 (ref 9–20)
BUN: 11 mg/dL (ref 6–20)
Bilirubin Total: 0.4 mg/dL (ref 0.0–1.2)
CO2: 26 mmol/L (ref 18–29)
Calcium: 9.4 mg/dL (ref 8.7–10.2)
Chloride: 101 mmol/L (ref 96–106)
Creatinine, Ser: 0.85 mg/dL (ref 0.76–1.27)
GFR calc Af Amer: 134 mL/min/{1.73_m2} (ref >59)
GFR calc non Af Amer: 116 mL/min/{1.73_m2} (ref >59)
Globulin, Total: 2.8 g/dL (ref 1.5–4.5)
Glucose: 90 mg/dL (ref 65–99)
Potassium: 4.3 mmol/L (ref 3.5–5.2)
Sodium: 140 mmol/L (ref 134–144)
Total Protein: 7.3 g/dL (ref 6.0–8.5)

## 2016-08-15 LAB — CBC WITH DIFFERENTIAL/PLATELET

## 2016-08-16 ENCOUNTER — Encounter: Payer: Self-pay | Admitting: Urgent Care

## 2016-09-06 ENCOUNTER — Ambulatory Visit (INDEPENDENT_AMBULATORY_CARE_PROVIDER_SITE_OTHER): Payer: 59 | Admitting: Physician Assistant

## 2016-09-06 ENCOUNTER — Ambulatory Visit (INDEPENDENT_AMBULATORY_CARE_PROVIDER_SITE_OTHER): Payer: 59

## 2016-09-06 VITALS — BP 113/66 | HR 64 | Temp 98.5°F | Resp 16 | Ht 65.0 in | Wt 191.8 lb

## 2016-09-06 DIAGNOSIS — M25561 Pain in right knee: Secondary | ICD-10-CM

## 2016-09-06 MED ORDER — MELOXICAM 15 MG PO TABS: 15.0000 mg | ORAL_TABLET | Freq: Every day | ORAL | 1 refills | Status: DC

## 2016-09-06 NOTE — Progress Notes (Signed)
PRIMARY CARE AT [facility] [address] [place] [address] [phone]  Date:  09/06/2016   Name:  Travis Estrada   DOB:  1986-03-04   MRN:  [id number]  PCP:  No PCP Per Patient    History of Present Illness:  Travis Estrada is a 31 y.o. male patient who presents to PCP with  Chief Complaint  Patient presents with  . Knee Pain    Right knee - x 2 weeks     He has pain in right knee for 2 weeks ago.  Pain is at the front of his knee, feels like he hears a noise when he touches it.  The pain at the front of the knee feels like a stabbing.  He has no known movement that aggravates it.  He noticed some swelling, which he used some lotion, over-the-counter, which helped, and massaging.  He feels unstable, as if it gives way.  4 years ago he had a similar pain.  He works at [employer], where he walks throughout the day.  Lifts heavy things.    There are no active problems to display for this patient.   Past Medical History:  Diagnosis Date  . Pterygium     No past surgical history on file.  Social History  Substance Use Topics  . Smoking status: Never Smoker  . Smokeless tobacco: Never Used  . Alcohol use Yes    Family History  Problem Relation Age of Onset  . Diabetes Mother   . Hypertension Sister     No Known Allergies  Medication list has been reviewed and updated.  Current Outpatient Prescriptions on File Prior to Visit  Medication Sig Dispense Refill  . gabapentin (NEURONTIN) 300 MG capsule Take 1 capsule (300 mg total) by mouth 3 (three) times daily. 120 capsule 1  . loperamide (IMODIUM) 2 MG capsule Take 2 capsules now, then 1 capsule tonight. Thereafter take 1 capsule daily for diarrhea. 30 capsule 0  . meloxicam (MOBIC) 15 MG tablet Take 1 tablet (15 mg total) by mouth daily. 30 tablet 1  . ondansetron (ZOFRAN) 4 MG tablet Take 1 tablet (4 mg total) by mouth every 8 (eight) hours as needed for nausea or vomiting. 12 tablet 0  . ondansetron (ZOFRAN-ODT) 8 MG  disintegrating tablet Take 1 tablet (8 mg total) by mouth every 8 (eight) hours as needed for nausea. 15 tablet 0  . pseudoephedrine (SUDAFED 12 HOUR) 120 MG 12 hr tablet Take 1 tablet (120 mg total) by mouth 2 (two) times daily. 30 tablet 3  . traMADol (ULTRAM) 50 MG tablet Take 1 tablet (50 mg total) by mouth every 8 (eight) hours as needed. (Patient not taking: Reported on 09/06/2016) 30 tablet 0   No current facility-administered medications on file prior to visit.     ROS ROS otherwise unremarkable unless listed above.  Physical Examination: BP 113/66   Pulse 64   Temp 98.5 F (36.9 C) (Oral)   Resp 16   Ht  (1.651 m)   Wt 191 lb 12.8 oz (87 kg)   SpO2 95%   BMI 31.92 kg/m  Ideal Body Weight: Weight in (lb) to have BMI = 25: 149.9  Physical Exam  Constitutional: He is oriented to person, place, and time. He appears well-developed and well-nourished. No distress.  HENT:  Head: Normocephalic and atraumatic.  Eyes: Conjunctivae and EOM are normal. Pupils are equal, round, and reactive to light.  Cardiovascular: Normal rate.   Pulmonary/Chest: Effort  normal. No respiratory distress.  Musculoskeletal:       Right knee: He exhibits decreased range of motion (decreased strength with right knee in resisted extension). He exhibits no swelling and normal patellar mobility. Tenderness (tender at the patella superio-lateral) found. No medial joint line and no lateral joint line tenderness noted.  Neurological: He is alert and oriented to person, place, and time.  Skin: Skin is warm and dry. He is not diaphoretic.  Psychiatric: He has a normal mood and affect. His behavior is normal.   Dg Knee Complete 4 Views Right  Result Date: 09/06/2016 CLINICAL DATA:  Acute right knee pain EXAM: RIGHT KNEE - COMPLETE 4+ VIEW COMPARISON:  Right tib-fib 03/26/2016 FINDINGS: Median lateral joint spaces are normal.  No joint space narrowing Irregularity of the superior and lateral portion of the  patella which appears to be tripartite patella. No joint effusion or fracture IMPRESSION: Tripartite patella.  Negative for fracture or effusion. Electronically Signed   By: Marlan Palau M.D.   On: 09/06/2016 16:16   Assessment and Plan: Travis Estrada is a 31 y.o. male who is here today for cc of right knee pain Advised icing, and use of his knee brace since he has improvement with this.  Advised to reduce movement at this time (no crawling, kneeling, high intesity work).  nsaid given. Return in 2 weeks or contact, and we can give referral for ortho. Acute pain of right knee - Plan: DG Knee Complete 4 Views Right  Travis Platt, PA-C [facility] 4/21/20189:47 AM

## 2016-09-06 NOTE — Patient Instructions (Addendum)
  I would like you to take the mobic.  Do not take ibuprofen or naproxen.  You can take tylenol. Ice the knee three times per day.   You are to return if you are not having improvement of your symptoms in 2 weeks.  You can call me as well.  I can refer you to an orthopedist.   Knee Pain, Adult Many things can cause knee pain. The pain often goes away on its own with time and rest. If the pain does not go away, tests may be done to find out what is causing the pain. Follow these instructions at home: Activity   Rest your knee.  Do not do things that cause pain.  Avoid activities where both feet leave the ground at the same time (high-impact activities). Examples are running, jumping rope, and doing jumping jacks. General instructions   Take medicines only as told by your doctor.  Raise (elevate) your knee when you are resting. Make sure your knee is higher than your heart.  Sleep with a pillow under your knee.  If told, put ice on the knee:  Put ice in a plastic bag.  Place a towel between your skin and the bag.  Leave the ice on for 20 minutes, 2-3 times a day.  Ask your doctor if you should wear an elastic knee support.  Lose weight if you are overweight. Being overweight can make your knee hurt more.  Do not use any tobacco products. These include cigarettes, chewing tobacco, or electronic cigarettes. If you need help quitting, ask your doctor. Smoking may slow down healing. Contact a doctor if:  The pain does not stop.  The pain changes or gets worse.  You have a fever along with knee pain.  Your knee gives out or locks up.  Your knee swells, and becomes worse. Get help right away if:  Your knee feels warm.  You cannot move your knee.  You have very bad knee pain.  You have chest pain.  You have trouble breathing. Summary  Many things can cause knee pain. The pain often goes away on its own with time and rest.  Avoid activities that put stress on your  knee. These include running and jumping rope.  Get help right away if you cannot move your knee, or if your knee feels warm, or if you have trouble breathing. This information is not intended to replace advice given to you by your health care provider. Make sure you discuss any questions you have with your health care provider. Document Released: 08/03/2008 Document Revised: 05/01/2016 Document Reviewed: 05/01/2016 Elsevier Interactive Patient Education  2017 [employer].   IF you received an x-ray today, you will receive an invoice from [facility]. Please contact [facility] at [phone] with questions or concerns regarding your invoice.   IF you received labwork today, you will receive an invoice from [facility]. Please contact [facility] at [phone] with questions or concerns regarding your invoice.   Our billing staff will not be able to assist you with questions regarding bills from these companies.  You will be contacted with the lab results as soon as they are available. The fastest way to get your results is to activate your My Chart account. Instructions are located on the last page of this paperwork. If you have not heard from [place] regarding the results in 2 weeks, please contact this office.

## 2016-09-11 ENCOUNTER — Telehealth: Payer: Self-pay | Admitting: Family Medicine

## 2016-09-11 ENCOUNTER — Encounter: Payer: Self-pay | Admitting: Emergency Medicine

## 2016-09-11 NOTE — Telephone Encounter (Signed)
PT JUST GOT OFF FROM WORK STATING THAT HIS KNEE IS STILL BOTHERING HIM AND THAT HE NEED ANOTHER OOW NOTE TO RETURN BACK TOMORROW IF BETTER INSTEAD OF GOING INTO WORK TODAY BECAUSE HE LEFT EARLY DUE TO PAIN PLEASE CALL PT WHEN REDY TO PICK UP

## 2016-09-12 NOTE — Telephone Encounter (Signed)
Ok to extend

## 2016-09-12 NOTE — Telephone Encounter (Signed)
This is fine to extend

## 2016-09-12 NOTE — Telephone Encounter (Signed)
Note done, up front for pick up and pt advised

## 2016-11-07 ENCOUNTER — Encounter: Payer: Self-pay | Admitting: Emergency Medicine

## 2016-11-07 ENCOUNTER — Ambulatory Visit (INDEPENDENT_AMBULATORY_CARE_PROVIDER_SITE_OTHER): Payer: 59 | Admitting: Emergency Medicine

## 2016-11-07 VITALS — BP 104/70 | HR 61 | Temp 98.1°F | Resp 16 | Ht 65.0 in | Wt 193.0 lb

## 2016-11-07 DIAGNOSIS — M549 Dorsalgia, unspecified: Secondary | ICD-10-CM | POA: Diagnosis not present

## 2016-11-07 DIAGNOSIS — M25561 Pain in right knee: Secondary | ICD-10-CM

## 2016-11-07 DIAGNOSIS — M791 Myalgia: Secondary | ICD-10-CM | POA: Diagnosis not present

## 2016-11-07 DIAGNOSIS — M62838 Other muscle spasm: Secondary | ICD-10-CM | POA: Diagnosis not present

## 2016-11-07 DIAGNOSIS — G8929 Other chronic pain: Secondary | ICD-10-CM

## 2016-11-07 DIAGNOSIS — M7918 Myalgia, other site: Secondary | ICD-10-CM

## 2016-11-07 MED ORDER — CYCLOBENZAPRINE HCL 10 MG PO TABS: 10.0000 mg | ORAL_TABLET | Freq: Three times a day (TID) | ORAL | 0 refills | Status: DC | PRN

## 2016-11-07 MED ORDER — DICLOFENAC SODIUM 75 MG PO TBEC: 75.0000 mg | DELAYED_RELEASE_TABLET | Freq: Two times a day (BID) | ORAL | 0 refills | Status: AC

## 2016-11-07 NOTE — Patient Instructions (Addendum)
     IF you received an x-ray today, you will receive an invoice from [facility]. Please contact [facility] at [phone] with questions or concerns regarding your invoice.   IF you received labwork today, you will receive an invoice from [facility]. Please contact [facility] at [phone] with questions or concerns regarding your invoice.   Our billing staff will not be able to assist you with questions regarding bills from these companies.  You will be contacted with the lab results as soon as they are available. The fastest way to get your results is to activate your My Chart account. Instructions are located on the last page of this paperwork. If you have not heard from us regarding the results in 2 weeks, please contact this office.     Dolor musculoesqueltico (Musculoskeletal Pain) El dolor musculoesqueltico comprende dolores y molestias en los msculos y en los huesos. Este dolor puede ocurrir en cualquier parte del cuerpo. INSTRUCCIONES PARA EL CUIDADO EN EL HOGAR  Solo tome medicamentos para calmar el dolor, el malestar o bajar la fiebre, segn las indicaciones del mdico.  Podr seguir con todas las actividades a menos que stas le ocasionen ms dolor. Cuando el dolor disminuya, retome las actividades habituales lentamente. Aumente gradualmente la intensidad y la duracin de sus actividades o del ejercicio.  Durante los perodos de dolor intenso, el reposo en cama puede ser beneficioso. Acustese o sintese en cualquier posicin que sea cmoda, pero salga de la cama y camine al menos cada varias horas.  Si se lo indican, aplique hielo sobre la zona de la lesin. ? Ponga el hielo en una bolsa plstica. ? Coloque una toalla entre la piel y la bolsa de hielo. ? Coloque el hielo durante 20minutos, 2 a 3veces por da.  SOLICITE ATENCIN MDICA SI:  El dolor empeora.  El dolor no se alivia con los medicamentos.  Pierde la funcionalidad en la zona del  dolor si este se manifiesta en los brazos, las piernas o el cuello.  Esta informacin no tiene como fin reemplazar el consejo del mdico. Asegrese de hacerle al mdico cualquier pregunta que tenga. Document Released: 02/14/2005 Document Revised: 07/30/2011 Document Reviewed: 01/09/2013 Elsevier Interactive Patient Education  2017 [employer]vier Inc.  

## 2016-11-07 NOTE — Progress Notes (Signed)
Travis Estrada 31 y.o.   Chief Complaint  Patient presents with  . Back Pain    started 11/06/16  . Shortness of Breath    HISTORY OF PRESENT ILLNESS: This is a 31 y.o. male complaining of right mid-back pain x 2-3 days; denies trauma but work requires hard labor. States when pain occurs feels like he loses his breath. Also has chronic right knee pain. No other significant symptoms.   HPI   Prior to Admission medications   Medication Sig Start Date End Date Taking? Authorizing Provider  gabapentin (NEURONTIN) 300 MG capsule Take 1 capsule (300 mg total) by mouth 3 (three) times daily. 03/26/16  Yes Bing Neighbors, FNP  loperamide (IMODIUM) 2 MG capsule Take 2 capsules now, then 1 capsule tonight. Thereafter take 1 capsule daily for diarrhea. 08/13/16  Yes Wallis Bamberg, PA-C  traMADol (ULTRAM) 50 MG tablet Take 1 tablet (50 mg total) by mouth every 8 (eight) hours as needed. 03/05/16  Yes Bing Neighbors, FNP  meloxicam (MOBIC) 15 MG tablet Take 1 tablet (15 mg total) by mouth daily. Patient not taking: Reported on 11/07/2016 09/06/16   Trena Platt D, PA  ondansetron (ZOFRAN) 4 MG tablet Take 1 tablet (4 mg total) by mouth every 8 (eight) hours as needed for nausea or vomiting. Patient not taking: Reported on 11/07/2016 07/10/16   Raeford Razor, MD  ondansetron (ZOFRAN-ODT) 8 MG disintegrating tablet Take 1 tablet (8 mg total) by mouth every 8 (eight) hours as needed for nausea. Patient not taking: Reported on 11/07/2016 08/13/16   Wallis Bamberg, PA-C  pseudoephedrine (SUDAFED 12 HOUR) 120 MG 12 hr tablet Take 1 tablet (120 mg total) by mouth 2 (two) times daily. Patient not taking: Reported on 11/07/2016 04/10/16   Wallis Bamberg, PA-C    No Known Allergies  There are no active problems to display for this patient.   Past Medical History:  Diagnosis Date  . Pterygium     No past surgical history on file.  Social History   Social History  . Marital status: Married   Spouse name: N/A  . Number of children: N/A  . Years of education: N/A   Occupational History  . Not on file.   Social History Main Topics  . Smoking status: Never Smoker  . Smokeless tobacco: Never Used  . Alcohol use Yes  . Drug use: No  . Sexual activity: Not on file   Other Topics Concern  . Not on file   Social History Narrative  . No narrative on file    Family History  Problem Relation Age of Onset  . Diabetes Mother   . Hypertension Sister      Review of Systems  Constitutional: Negative.  Negative for chills, fever and weight loss.  HENT: Negative.   Eyes: Negative.   Respiratory: Negative.  Negative for cough, hemoptysis, shortness of breath and wheezing.   Cardiovascular: Negative.  Negative for chest pain, palpitations and leg swelling.  Gastrointestinal: Negative.  Negative for abdominal pain, heartburn, nausea and vomiting.  Genitourinary: Negative for dysuria and hematuria.  Musculoskeletal: Positive for back pain and joint pain (right knee).  Skin: Negative.  Negative for rash.  Neurological: Negative.  Negative for dizziness and headaches.  Endo/Heme/Allergies: Negative.   All other systems reviewed and are negative.  Vitals:   11/07/16 0950  BP: 104/70  Pulse: 61  Resp: 16  Temp: 98.1 F (36.7 C)     Physical Exam  Constitutional: He is  oriented to person, place, and time. He appears well-developed and well-nourished.  HENT:  Head: Normocephalic and atraumatic.  Nose: Nose normal.  Mouth/Throat: Oropharynx is clear and moist. No oropharyngeal exudate.  Eyes: Conjunctivae and EOM are normal. Pupils are equal, round, and reactive to light.  Neck: Normal range of motion. Neck supple. No JVD present.  Cardiovascular: Normal rate, regular rhythm, normal heart sounds and intact distal pulses.   Pulmonary/Chest: Effort normal and breath sounds normal.  Abdominal: Soft. Bowel sounds are normal. He exhibits no distension. There is no tenderness.    Musculoskeletal:  Right mid-back: +muscle spasm with tenderness. Right Knee: FROM, stable, no tenderness, erythema, or swelling.  Lymphadenopathy:    He has no cervical adenopathy.  Neurological: He is alert and oriented to person, place, and time. No sensory deficit. He exhibits normal muscle tone.  Skin: Skin is warm and dry. Capillary refill takes less than 2 seconds. No rash noted.  Psychiatric: He has a normal mood and affect. His behavior is normal.  Vitals reviewed.    ASSESSMENT & PLAN: Kongmeng was seen today for back pain and shortness of breath.  Diagnoses and all orders for this visit:  Mid back pain on right side  Muscle spasm -     cyclobenzaprine (FLEXERIL) 10 MG tablet; Take 1 tablet (10 mg total) by mouth 3 (three) times daily as needed for muscle spasms.  Musculoskeletal pain -     diclofenac (VOLTAREN) 75 MG EC tablet; Take 1 tablet (75 mg total) by mouth 2 (two) times daily.  Chronic pain of right knee -     Ambulatory referral to Orthopedic Surgery    Patient Instructions       IF you received an x-ray today, you will receive an invoice from [facility]. Please contact [facility] at [phone] with questions or concerns regarding your invoice.   IF you received labwork today, you will receive an invoice from [facility]. Please contact [facility] at [phone] with questions or concerns regarding your invoice.   Our billing staff will not be able to assist you with questions regarding bills from these companies.  You will be contacted with the lab results as soon as they are available. The fastest way to get your results is to activate your My Chart account. Instructions are located on the last page of this paperwork. If you have not heard from [place] regarding the results in 2 weeks, please contact this office.     Dolor musculoesqueltico (Musculoskeletal Pain) El dolor musculoesqueltico comprende dolores y Associate Professor en los msculos  y en los [place]. Este dolor puede ocurrir en cualquier parte del cuerpo. INSTRUCCIONES PARA EL CUIDADO EN EL HOGAR  Solo tome medicamentos para calmar el dolor, el malestar o bajar la fiebre, segn las indicaciones del mdico.  Podr seguir con todas las actividades a menos que stas le ocasionen ms [employer] Cuando el dolor disminuya, retome las actividades habituales lentamente. Aumente gradualmente la intensidad y la duracin de sus actividades o del ejercicio.  Durante los perodos de dolor intenso, el reposo en cama puede ser beneficioso. Acustese o sintese en cualquier posicin que sea cmoda, pero salga de la cama y camine al menos cada varias horas.  Si se lo indican, aplique hielo sobre la zona de la lesin. ? Ponga el hielo en una bolsa plstica. ? Coloque una [employer] piel y la bolsa de hielo. ? Coloque el hielo durante , 2 a 3veces por da.  SOLICITE ATENCIN MDICA SI:  El dolor [place].  El dolor no se alivia con los United Parcelmedicamentos.  Pierde la funcionalidad en la zona del dolor si este se manifiesta en los brazos, las piernas o el cuello.  Esta informacin no tiene Theme park managercomo fin reemplazar el consejo del mdico. Asegrese de hacerle al mdico cualquier pregunta que tenga. Document Released: 02/14/2005 Document Revised: 07/30/2011 Document Reviewed: 01/09/2013 Elsevier Interactive Patient Education  2017 Elsevier Inc.      Edwina BarthMiguel Katelin Kutsch, MD [facility]

## 2016-12-07 ENCOUNTER — Encounter (INDEPENDENT_AMBULATORY_CARE_PROVIDER_SITE_OTHER): Payer: Self-pay | Admitting: Orthopaedic Surgery

## 2016-12-07 ENCOUNTER — Ambulatory Visit (INDEPENDENT_AMBULATORY_CARE_PROVIDER_SITE_OTHER): Payer: 59 | Admitting: Orthopaedic Surgery

## 2016-12-07 DIAGNOSIS — M25561 Pain in right knee: Secondary | ICD-10-CM | POA: Diagnosis not present

## 2016-12-07 DIAGNOSIS — G8929 Other chronic pain: Secondary | ICD-10-CM

## 2016-12-07 NOTE — Progress Notes (Signed)
   Office Visit Note   Patient: Travis BrackettRene Estrada           Date of Birth: 10/12/1985           MRN: [id number] Visit Date: 12/07/2016              Requested by: Georgina QuintSagardia, Miguel Jose, MD [address] [place], [place] [id number] PCP: Patient, No Pcp Per   Assessment & Plan: Visit Diagnoses:  1. Chronic pain of right knee     Plan: Overall impression is that it patella ossicle. The x-ray shows a tripartite patella. I want to get a MRI to better characterize this abnormality. Follow-up after the MRI.  Follow-Up Instructions: Return in about 2 weeks (around 12/21/2016).   Orders:  Orders Placed This Encounter  Procedures  . MR Knee Right w/o contrast   No orders of the defined types were placed in this encounter.     Procedures: No procedures performed   Clinical Data: No additional findings.   Subjective: Chief Complaint  Patient presents with  . Right Knee - Pain    Patient is a 31 year old gentleman comes in with right knee pain for a while. Denies any injuries. The pain is directly over the superior aspect. he does endorse swelling. It is also tender to palpation. He says the pain sometimes radiates down the leg. He takes tramadol. Denies any numbness or tingling.    Review of Systems  Constitutional: Negative.   All other systems reviewed and are negative.    Objective: Vital Signs: There were no vitals taken for this visit.  Physical Exam  Constitutional: He is oriented to person, place, and time. He appears well-developed and well-nourished.  HENT:  Head: Normocephalic and atraumatic.  Eyes: Pupils are equal, round, and reactive to light.  Neck: Neck supple.  Pulmonary/Chest: Effort normal.  Abdominal: Soft.  Musculoskeletal: Normal range of motion.  Neurological: He is alert and oriented to person, place, and time.  Skin: Skin is warm.  Psychiatric: He has a normal mood and affect. His behavior is normal. Judgment and thought content normal.  Nursing note  and vitals reviewed.   Ortho Exam Right knee exam shows no joint effusion. He has very tender over the ossicle of the patella. Collaterals and cruciates are stable. No joint line tenderness. Specialty Comments:  No specialty comments available.  Imaging: No results found.   PMFS History: Patient Active Problem List   Diagnosis Date Noted  . Mid back pain on right side 11/07/2016  . Muscle spasm 11/07/2016  . Musculoskeletal pain 11/07/2016  . Chronic pain of right knee 11/07/2016   Past Medical History:  Diagnosis Date  . Pterygium     Family History  Problem Relation Age of Onset  . Diabetes Mother   . Hypertension Sister     No past surgical history on file. Social History   Occupational History  . Not on file.   Social History Main Topics  . Smoking status: Never Smoker  . Smokeless tobacco: Never Used  . Alcohol use Yes  . Drug use: No  . Sexual activity: Not on file

## 2016-12-24 ENCOUNTER — Ambulatory Visit
Admission: RE | Admit: 2016-12-24 | Discharge: 2016-12-24 | Disposition: A | Discharge status: Home / Self Care | Payer: 59 | Source: Ambulatory Visit | Attending: Orthopaedic Surgery | Admitting: Orthopaedic Surgery

## 2016-12-24 DIAGNOSIS — G8929 Other chronic pain: Secondary | ICD-10-CM

## 2016-12-24 DIAGNOSIS — M25561 Pain in right knee: Principal | ICD-10-CM

## 2017-01-14 ENCOUNTER — Encounter: Payer: Self-pay | Admitting: Emergency Medicine

## 2017-01-14 ENCOUNTER — Ambulatory Visit (INDEPENDENT_AMBULATORY_CARE_PROVIDER_SITE_OTHER): Payer: 59 | Admitting: Emergency Medicine

## 2017-01-14 VITALS — BP 104/69 | HR 77 | Temp 97.3°F | Resp 18 | Ht 65.51 in | Wt 195.0 lb

## 2017-01-14 DIAGNOSIS — K529 Noninfective gastroenteritis and colitis, unspecified: Secondary | ICD-10-CM | POA: Insufficient documentation

## 2017-01-14 DIAGNOSIS — R197 Diarrhea, unspecified: Secondary | ICD-10-CM | POA: Insufficient documentation

## 2017-01-14 DIAGNOSIS — R1084 Generalized abdominal pain: Secondary | ICD-10-CM | POA: Diagnosis not present

## 2017-01-14 MED ORDER — LOPERAMIDE HCL 2 MG PO TABS: 2.0000 mg | ORAL_TABLET | Freq: Four times a day (QID) | ORAL | 0 refills | Status: DC | PRN

## 2017-01-14 NOTE — Patient Instructions (Addendum)
     IF you received an x-ray today, you will receive an invoice from [facility]. Please contact [facility] at [phone] with questions or concerns regarding your invoice.   IF you received labwork today, you will receive an invoice from [facility]. Please contact [facility] at [phone] with questions or concerns regarding your invoice.   Our billing staff will not be able to assist you with questions regarding bills from these companies.  You will be contacted with the lab results as soon as they are available. The fastest way to get your results is to activate your My Chart account. Instructions are located on the last page of this paperwork. If you have not heard from us regarding the results in 2 weeks, please contact this office.      Dieta suave (Bland Diet) La dieta suave se compone de alimentos que no contienen mucha grasa o fibra. Para el cuerpo, es ms fcil digerir los alimentos con bajo contenido de grasa o de fibra. Adems, es menos probable que estos causen irritacin en la boca, la garganta, el estmago y otras partes del tubo digestivo. A menudo, se conoce a la dieta suave como dieta BRAT (por sus siglas en ingls). EN QU CONSISTE EL PLAN? El mdico o el nutricionista pueden recomendar cambios especficos en la dieta para evitar y tratar los sntomas, por ejemplo:  Consumir pequeas cantidades de comida con frecuencia.  Cocinar los alimentos hasta que estn lo bastante blandos para masticarlos con facilidad.  Masticar bien la comida.  Beber lquidos lentamente.  No consumir alimentos muy picantes, cidos o grasosos.  No comer frutas ctricas, como naranjas y pomelos. QU DEBO SABER ACERCA DE ESTA DIETA?  Consuma diferentes alimentos de lista de alimentos de la dieta suave.  No siga una dieta suave durante ms tiempo del debido.  Pregntele al mdico si debe tomar vitaminas. QU ALIMENTOS PUEDO COMER? Cereales Cereales calientes, como  crema de trigo. Panes, galletas o tortillas elaborados con harina blanca refinada. Arroz. Verduras Verduras cocidas o enlatadas. Pur de papas o papas hervidas. Frutas Bananas. Pur de manzana. Otros tipos de frutas cocidas o enlatadas peladas y sin semillas, por ejemplo, duraznos o peras en lata. Carnes y otras fuentes de protenas Huevos revueltos. Mantequilla de man cremosa u otras mantequillas de frutos secos. Carnes magras bien cocidas, como ave o pescado. Tofu. Sopas o caldos. Lcteos Productos lcteos sin grasa, como leche, queso cottage y yogur. Bebidas Agua. T de hierbas. Jugo de manzana. Dulces y postres Pudin. Natillas. Gelatina de frutas. Helados. Grasas y aceites Aderezos suaves para ensaladas. Aceite de canola o de oliva. Esta no es una lista completa de los alimentos o las bebidas permitidos. Comunquese con el nutricionista para conocer ms opciones. QU ALIMENTOS NO SE RECOMIENDAN? Los alimentos y los ingredientes que frecuentemente no se recomiendan incluyen los siguientes:  Alimentos picantes, como salsas picantes.  Comidas fritas.  Alimentos cidos, como encurtidos o alimentos fermentados.  Verduras o frutas crudas, especialmente ctricos o frutos del bosque.  Bebidas que contengan cafena.  Alcohol.  Aderezos o condimentos muy saborizados. Es posible que los productos que se enumeran ms arriba no sean una lista completa de los alimentos y las bebidas que no estn permitidos. Comunquese con el nutricionista para obtener ms informacin. Esta informacin no tiene como fin reemplazar el consejo del mdico. Asegrese de hacerle al mdico cualquier pregunta que tenga. Document Released: 08/29/2015 Document Revised: 08/29/2015 Document Reviewed: 05/19/2014 Elsevier Interactive Patient Education  2018 Elsevier Inc.  

## 2017-01-14 NOTE — Progress Notes (Signed)
Travis Estrada 31 y.o.   Chief Complaint  Patient presents with  . Abdominal Pain    X 1 day  . Diarrhea    X 1 day    HISTORY OF PRESENT ILLNESS: This is a 31 y.o. male complaining of diarrhea and generalized abdominal cramping since last night after eating shrimp soup; wife also sick with same. Diarrhea   This is a new problem. The current episode started yesterday. The problem occurs 5 to 10 times per day. The problem has been waxing and waning. The stool consistency is described as blood tinged. The patient states that diarrhea awakens him from sleep. Associated symptoms include abdominal pain. Pertinent negatives include no chills, coughing, fever, headaches or vomiting. Risk factors include suspect food intake. He has tried nothing for the symptoms. There is no history of bowel resection, inflammatory bowel disease, irritable bowel syndrome, malabsorption or a recent abdominal surgery.     Prior to Admission medications   Medication Sig Start Date End Date Taking? Authorizing Provider  cyclobenzaprine (FLEXERIL) 10 MG tablet Take 1 tablet (10 mg total) by mouth 3 (three) times daily as needed for muscle spasms. Patient not taking: Reported on 01/14/2017 11/07/16   Georgina Quint, MD  gabapentin (NEURONTIN) 300 MG capsule Take 1 capsule (300 mg total) by mouth 3 (three) times daily. Patient not taking: Reported on 01/14/2017 03/26/16   Bing Neighbors, FNP  loperamide (IMODIUM) 2 MG capsule Take 2 capsules now, then 1 capsule tonight. Thereafter take 1 capsule daily for diarrhea. 08/13/16   Wallis Bamberg, PA-C  meloxicam (MOBIC) 15 MG tablet Take 1 tablet (15 mg total) by mouth daily. Patient not taking: Reported on 01/14/2017 09/06/16   Trena Platt D, PA  ondansetron (ZOFRAN) 4 MG tablet Take 1 tablet (4 mg total) by mouth every 8 (eight) hours as needed for nausea or vomiting. 07/10/16   Raeford Razor, MD  ondansetron (ZOFRAN-ODT) 8 MG disintegrating tablet Take 1 tablet (8  mg total) by mouth every 8 (eight) hours as needed for nausea. 08/13/16   Wallis Bamberg, PA-C  pseudoephedrine (SUDAFED 12 HOUR) 120 MG 12 hr tablet Take 1 tablet (120 mg total) by mouth 2 (two) times daily. 04/10/16   Wallis Bamberg, PA-C  traMADol (ULTRAM) 50 MG tablet Take 1 tablet (50 mg total) by mouth every 8 (eight) hours as needed. 03/05/16   Bing Neighbors, FNP    No Known Allergies  Patient Active Problem List   Diagnosis Date Noted  . Mid back pain on right side 11/07/2016  . Muscle spasm 11/07/2016  . Musculoskeletal pain 11/07/2016  . Chronic pain of right knee 11/07/2016    Past Medical History:  Diagnosis Date  . Pterygium     History reviewed. No pertinent surgical history.  Social History   Social History  . Marital status: Married    Spouse name: N/A  . Number of children: N/A  . Years of education: N/A   Occupational History  . Not on file.   Social History Main Topics  . Smoking status: Never Smoker  . Smokeless tobacco: Never Used  . Alcohol use 3.0 oz/week    5 Cans of beer per week  . Drug use: No  . Sexual activity: Not on file   Other Topics Concern  . Not on file   Social History Narrative  . No narrative on file    Family History  Problem Relation Age of Onset  . Diabetes Mother   .  Hypertension Sister      Review of Systems  Constitutional: Negative for chills and fever.  HENT: Negative.  Negative for congestion, ear pain and sore throat.   Eyes: Negative for discharge and redness.  Respiratory: Negative.  Negative for cough and shortness of breath.   Cardiovascular: Negative.  Negative for chest pain and palpitations.  Gastrointestinal: Positive for abdominal pain and diarrhea. Negative for melena, nausea and vomiting.  Genitourinary: Negative for dysuria and hematuria.  Musculoskeletal: Negative.   Skin: Negative.   Neurological: Negative for dizziness, sensory change and headaches.  Endo/Heme/Allergies: Negative.   All  other systems reviewed and are negative.   Vitals:   01/14/17 1225  BP: 104/69  Pulse: 77  Resp: 18  Temp: (!) 97.3 F (36.3 C)  SpO2: 98%    Physical Exam  Constitutional: He is oriented to person, place, and time. He appears well-developed and well-nourished.  HENT:  Head: Normocephalic and atraumatic.  Right Ear: External ear normal.  Left Ear: External ear normal.  Mouth/Throat: Oropharynx is clear and moist.  Eyes: Pupils are equal, round, and reactive to light. Conjunctivae and EOM are normal.  Neck: Normal range of motion. Neck supple. No JVD present. No thyromegaly present.  Cardiovascular: Normal rate, regular rhythm, normal heart sounds and intact distal pulses.   Pulmonary/Chest: Effort normal and breath sounds normal.  Abdominal: Soft. Bowel sounds are normal. He exhibits no distension and no mass. There is no tenderness. There is no rebound.  Musculoskeletal: Normal range of motion.  Lymphadenopathy:    He has no cervical adenopathy.  Neurological: He is alert and oriented to person, place, and time. No sensory deficit. He exhibits normal muscle tone.  Skin: Skin is warm and dry. Capillary refill takes less than 2 seconds. No rash noted.  Psychiatric: He has a normal mood and affect. His behavior is normal.  Vitals reviewed.    ASSESSMENT & PLAN: [name] was seen today for abdominal pain and diarrhea.  Diagnoses and all orders for this visit:  Generalized abdominal pain  Diarrhea of presumed infectious origin  Acute gastroenteritis  Other orders -     loperamide (LOPERAMIDE A-D) 2 MG tablet; Take 1 tablet (2 mg total) by mouth 4 (four) times daily as needed for diarrhea or loose stools.    Patient Instructions       IF you received an x-ray today, you will receive an invoice from [facility]. Please contact [facility] at [phone] with questions or concerns regarding your invoice.   IF you received labwork today, you will  receive an invoice from [facility]. Please contact [facility] at [phone] with questions or concerns regarding your invoice.   Our billing staff will not be able to assist you with questions regarding bills from these companies.  You will be contacted with the lab results as soon as they are available. The fastest way to get your results is to activate your My Chart account. Instructions are located on the last page of this paperwork. If you have not heard from [place] regarding the results in 2 weeks, please contact this office.     Dieta suave (Bland Diet) La dieta suave se compone de alimentos que no contienen [place]. Para el cuerpo, es ms fcil digerir los alimentos con bajo contenido de [place] o de [place]. Adems, es menos probable que estos causen [employer] boca, la garganta, el estmago y otras partes del tubo digestivo. A menudo, se conoce a la dieta  suave como dieta BRAT (por sus siglas en ingls). EN QU CONSISTE EL PLAN? El mdico o el nutricionista pueden recomendar cambios especficos en la dieta para evitar y tratar los sntomas, por ejemplo:  Consumir pequeas cantidades de comida con frecuencia.  Cocinar los alimentos hasta que estn lo bastante blandos para masticarlos con facilidad.  Masticar bien la comida.  Beber lquidos lentamente.  No consumir alimentos muy picantes, cidos o grasosos.  No comer frutas ctricas, como naranjas y pomelos. QU DEBO SABER ACERCA DE ESTA DIETA?  Consuma diferentes alimentos de lista de alimentos de la dieta [place].  No siga una dieta suave durante ms tiempo del [place].  Pregntele al mdico si debe tomar vitaminas. QU ALIMENTOS PUEDO COMER? Cereales Cereales calientes, como crema de trigo. Panes, galletas o tortillas elaborados con harina blanca refinada. Arroz. Verduras Verduras cocidas o enlatadas. Pur de papas o papas hervidas. Nils Pyle Bananas. Pur de [employer]. Otros tipos de frutas cocidas o enlatadas  peladas y sin semillas, por ejemplo, duraznos o peras en lata. Carnes y otras fuentes de protenas Huevos revueltos. Mantequilla de man cremosa u otras mantequillas de frutos secos. Carnes [employer] cocidas, como ave o pescado. Tofu. Sopas o caldos. Lcteos Productos lcteos sin grasa, como [place], queso cottage y Dentist. [employer] T de hierbas. Jugo de [place]. Dulces y postres Pudin. Natillas. Gelatina de frutas. Helados. Grasas y aceites Aderezos suaves para ensaladas. Aceite de canola o de oliva. Esta no es [employer] de los alimentos o las bebidas permitidos. Comunquese con el nutricionista para conocer ms opciones. QU ALIMENTOS NO SE RECOMIENDAN? Los alimentos y los ingredientes que frecuentemente no se recomiendan incluyen los siguientes:  Alimentos picantes, como salsas picantes.  Comidas fritas.  Alimentos cidos, como encurtidos o alimentos fermentados.  Verduras o frutas crudas, especialmente ctricos o frutos del bosque.  Bebidas que contengan cafena.  Alcohol.  Aderezos o condimentos muy saborizados. Es posible que los productos que se enumeran ms arriba no sean una lista completa de los alimentos y las bebidas que no estn permitidos. Comunquese con el nutricionista para obtener ms informacin. Esta informacin no tiene Theme park manager el consejo del mdico. Asegrese de hacerle al mdico cualquier pregunta que tenga. Document Released: 08/29/2015 Document Revised: 08/29/2015 Document Reviewed: 05/19/2014 Elsevier Interactive Patient Education  2018 Elsevier Inc.     Edwina Barth, MD [facility]

## 2017-02-11 ENCOUNTER — Encounter: Payer: Self-pay | Admitting: Family Medicine

## 2017-02-11 ENCOUNTER — Ambulatory Visit (INDEPENDENT_AMBULATORY_CARE_PROVIDER_SITE_OTHER): Payer: 59 | Admitting: Family Medicine

## 2017-02-11 VITALS — BP 110/72 | HR 77 | Temp 98.1°F | Resp 18 | Ht 65.51 in | Wt 194.4 lb

## 2017-02-11 DIAGNOSIS — F4323 Adjustment disorder with mixed anxiety and depressed mood: Secondary | ICD-10-CM | POA: Diagnosis not present

## 2017-02-11 MED ORDER — SERTRALINE HCL 50 MG PO TABS: 50.0000 mg | ORAL_TABLET | Freq: Every day | ORAL | 3 refills | Status: DC

## 2017-02-11 NOTE — Patient Instructions (Addendum)
1. Sertraline: empezar con media tableta una vez al dia por 2 semanas, luego aumentar a una tableta diaria.    Trastorno Archivist (Major Depressive Disorder, Adult) El trastorno depresivo mayor es una enfermedad mental. Tambin se la conoce como depresin clnica o depresin unipolar. Este trastorno suele causar sentimientos de tristeza, desesperanza o impotencia. Tambin puede provocar sntomas fsicos. Puede afectar el desempeo laboral o acadmico, las relaciones personales y [place] cotidianas. El trastorno puede ser [place], moderado o grave. Puede ocurrir una sola vez (trastorno depresivo mayor aislado) o varias veces (trastorno depresivo mayor recurrente). CAUSAS Se desconoce la causa exacta de esta afeccin. Suele ser causada por una combinacin de factores, que pueden incluir los siguientes:  Factores genticos. Son rasgos que se transmiten de los padres a los hijos.  Factores individuales. Su personalidad, comportamiento y Engineer, building services en que domina sus pensamientos y sentimientos pueden contribuir al trastorno Location manager. Esto incluye rasgos de personalidad y comportamientos adquiridos de Economist.  Factores fsicos, tales como: ? Production manager regin del cerebro que controla las emociones. Esta regin del cerebro puede ser distinta de la de las personas que no padecen este trastorno. ? Enfermedades mdicas o psiquitricas a Air cabin crew (crnicas).  [employer]. Las experiencias traumticas o los cambios importantes en la vida de una persona pueden contribuir al trastorno depresivo mayor. FACTORES DE RIESGO Es ms probable que esta afeccin se manifieste en las mujeres. Los siguientes factores pueden hacer que usted sea propenso a Marine scientist un trastorno depresivo mayor:  Un historial familiar de depresin.  Relaciones familiares conflictivas.  Niveles anormalmente bajos de ciertas sustancias qumicas en el cerebro.  Eventos  traumticos en la infancia, especialmente el maltrato o la prdida de un padre.  Vivir muchas situaciones de estrs o sufrir estrs crnico, en especial debido a experiencias de vida desagradables o prdidas.  Tener antecedentes de lo siguiente: ? Enfermedad fsica crnica. ? Otros trastornos [employer]. ? Consumo de drogas.  Condiciones de vida deficientes.  Sufrir exclusin o discriminacin social a diario. SNTOMAS Los principales sntomas del trastorno depresivo mayor, por lo general, incluyen los siguientes:  Depresin o irritabilidad constantes.  Prdida de inters en cosas y actividades. Otros sntomas pueden ser los siguientes:  Dormir o Microbiologist o muy poco.  Cambios imprevistos en el peso.  Estar fatigado o falto de Engineer, drilling.  Sentimientos de inutilidad o culpa.  Dificultad para pensar con claridad o tomar decisiones.  Pensamientos suicidas o de Physicist, medical a Economist.  Agitacin o debilidad fsicas.  Aislarse. Los [employer] graves del trastorno depresivo mayor pueden implicar otros sntomas, como por ejemplo:  Ideas delirantes o alucinaciones, que hacen imaginar cosas que no son reales (depresin psictica).  Depresin leve que dura como mnimo un ao (depresin crnica o trastorno depresivo persistente).  Sentimientos intensos de tristeza y desesperanza (Geologist, engineering).  Dificultad para hablar y moverse (depresin catatnica). DIAGNSTICO Esta afeccin se puede diagnosticar en funcin de lo siguiente:  Sus sntomas.  Sus antecedentes mdicos, incluido su historial de salud mental. Esto podra incluir exmenes para evaluar su salud mental. Es posible que deba responder preguntas sobre su estilo de vida, incluso si consume drogas o alcohol, y por cunto tiempo ha tenido sntomas del trastorno Location manager.  Un examen fsico.  Anlisis de sangre para descartar otras afecciones. Para que el mdico confirme un diagnstico de trastorno  depresivo mayor, usted debe tener un estado de nimo depresivo y al menos cuatro sntomas durante la mayor  parte del da, casi [employer], en un plazo de [employer]. TRATAMIENTO Los profesionales que por lo general tratan este trastorno pertenecen al rea de la salud mental, como psiclogos, psiquiatras y trabajadores sociales clnicos. Puede ser que necesite ms de un tipo de [place]. El tratamiento puede incluir lo siguiente:  Psicoterapia. Esto tambin se puede denominar apoyo psicolgico o asistencia psicolgica. Los tipos de psicoterapia incluyen lo siguiente: ? Terapia cognitivo conductual (TCC). Este tipo de terapia le ensea a Education administrator, pensamientos y comportamientos poco saludables, y a Teacher, music con pensamientos y acciones positivas. ? Terapia interpersonal (TI). Lo ayuda a mejorar la [employer] se relaciona y Building surveyor con los dems. ? Information systems manager. Este tratamiento incluye a los miembros de la familia.  Medicamentos para controlar la ansiedad y la depresin, o para ayudarlo a Wellsite geologist emociones y comportamientos.  Cambios de estilo de vida tales como: ? Limitar el consumo de alcohol y drogas. ? Hacer actividad fsica con regularidad. ? Dormir lo suficiente. ? Optar por alimentos saludables. ? Pasar ms tiempo al [employer]. Se pueden realizar tratamientos que implican la estimulacin cerebral en caso de que haya sntomas muy graves, o cuando los medicamentos u otros tratamientos no funcionen. Estos tratamientos incluyen terapia electroconvulsiva, estimulacin magntica transcraneal y estimulacin del nervio vago. INSTRUCCIONES PARA EL CUIDADO EN EL HOGAR Actividad  Retome sus actividades normales como se lo haya indicado el mdico.  Haga actividad fsica con frecuencia y pase tiempo al aire libre como se lo haya indicado el mdico. Instrucciones generales  [employer] medicamentos de venta libre y los recetados solamente como se lo haya  indicado el mdico.  No beba alcohol. Si toma alcohol, limite el consumo a no ms de por da si es mujer y no est [place], y por da si es hombre. Una medida equivale a 12onzas de cerveza, 5onzas de vino o 1onzas de bebidas alcohlicas de alta graduacin. El alcohol puede inhibir el efecto de los antidepresivos. Hable con el mdico sobre el consumo de alcohol.  Siga una dieta saludable y duerma lo suficiente.  Encuentre actividades que disfrute y hgase el tiempo para Community education officer.  Considere la posibilidad de Advertising account planner en un grupo de apoyo. El mdico podra recomendarle un grupo de apoyo.  Concurra a todas las visitas de control como se lo haya indicado el mdico. Esto es importante. SOLICITE ATENCIN MDICA SI:  Los sntomas empeoran.  Presenta nuevos sntomas.  SOLICITE ATENCIN MDICA DE INMEDIATO SI:  Se autoagrede.  Tiene pensamientos serios acerca de lastimarse a usted mismo o daar a Economist.  Ve, oye, sab[place], huele o siente cosas que no estn presentes (alucinaciones).  PARA OBTENER MS INFORMACIN [employer] sobre Enfermedades Mentales [employer] on Mental Illness)  www.nami.org [employer] de la Salud Mental de Surveyor, minerals. (U.S. [employer] of Mental Health)  http://www.maynard.net/ Lnea Telefnica Nacional para la Prevencin del Suicidio (National Suicide Prevention Lifeline)  1-800-273-TALK 705 433 9258). La asistencia es gratuita y est disponible las 24horas. Esta informacin no tiene Theme park manager el consejo del mdico. Asegrese de hacerle al mdico cualquier pregunta que tenga. Document Released: 09/01/2012 Document Revised: 05/28/2014 Document Reviewed: 11/16/2015 Elsevier Interactive Patient Education  2017 [employer].     IF you received an x-ray today, you will receive an invoice from [facility]. Please contact [facility] at [phone] with questions or concerns regarding  your invoice.   IF you received labwork today, you will receive an invoice from [place]. Please  contact [facility] at [phone] with questions or concerns regarding your invoice.   Our billing staff will not be able to assist you with questions regarding bills from these companies.  You will be contacted with the lab results as soon as they are available. The fastest way to get your results is to activate your My Chart account. Instructions are located on the last page of this paperwork. If you have not heard from [place] regarding the results in 2 weeks, please contact this office.

## 2017-02-11 NOTE — Progress Notes (Signed)
9/24/20185:56 PM  Travis Estrada Aug 14, 1985, 31 y.o. male [id number]  Chief Complaint  Patient presents with  . Fatigue    x3days pt states he believes it is stress   . Stress  . Headache  . Depression    screening was a 9    HPI:   Patient is a 31 y.o. male with who presents today for not feeling himself for past several months. He reports not wanting to do things he normally enjoys, socially isolating, always worrying. He believes this is all related to his work, he has been at his current job for 6 years and describes his boss as very demeaning, always yelling, non-supportive. On the weekends he is dreading Mondays, he is always thinking about his job and resenting it. 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   He denies any previous episodes of depression or anxiety. He does not think there is a h/o mental illness in his family.   Depression screen [facility] 2/9 02/11/2017 01/14/2017 11/07/2016  Decreased Interest 2 0 0  Down, Depressed, Hopeless 2 0 0  PHQ - 2 Score 4 0 0  Altered sleeping 2 - -  Tired, decreased energy 2 - -  Change in appetite 0 - -  Feeling bad or failure about yourself  1 - -  Trouble concentrating 0 - -  Moving slowly or fidgety/restless 0 - -  Suicidal thoughts 0 - -  PHQ-9 Score 9 - -   GAD 7 : Generalized Anxiety Score 02/11/2017  Nervous, Anxious, on Edge 3  Control/stop worrying 3  Worry too much - different things 3  Trouble relaxing 2  Restless 2  Easily annoyed or irritable 3  Afraid - awful might happen 3  Total GAD 7 Score 19     No Known Allergies  No current outpatient prescriptions on file prior to visit.   No current facility-administered medications on file prior to visit.     Past Medical History:  Diagnosis Date  . Pterygium     History reviewed. No pertinent surgical history.  Social History  Substance Use Topics  . Smoking status: Never Smoker  . Smokeless tobacco: Never Used  .  Alcohol use 3.0 oz/week    5 Cans of beer per week    Family History  Problem Relation Age of Onset  . Diabetes Mother   . Hypertension Sister     Review of Systems  Constitutional: Positive for diaphoresis and malaise/fatigue. Negative for chills, fever and weight loss.  Respiratory: Positive for shortness of breath.   Cardiovascular: Positive for palpitations. Negative for chest pain.  Gastrointestinal: Positive for diarrhea. Negative for abdominal pain, nausea and vomiting.  Musculoskeletal: Negative for neck pain.  Neurological: Positive for tremors and headaches. Negative for weakness.  Psychiatric/Behavioral: Positive for depression. Negative for hallucinations, substance abuse and suicidal ideas. The patient is nervous/anxious.      OBJECTIVE:  Blood pressure 110/72, pulse 77, temperature 98.1 F (36.7 C), temperature source Oral, resp. rate 18, height 5' 5.51" (1.664 m), weight 194 lb 6.4 oz (88.2 kg), SpO2 98 %.  Physical Exam  Constitutional: He is oriented to person, place, and time and well-developed, well-nourished, and in no distress.  HENT:  Head: Normocephalic and atraumatic.  Mouth/Throat: Oropharynx is clear and moist.  Eyes: Pupils are equal, round, and reactive to light. EOM are normal.  Neck: Neck supple. No thyroid mass and no thyromegaly present.  Cardiovascular: Normal rate and  regular rhythm.  Exam reveals no gallop and no friction rub.   No murmur heard. Pulmonary/Chest: Effort normal and breath sounds normal. He has no wheezes. He has no rales.  Neurological: He is alert and oriented to person, place, and time. Gait normal.  Skin: Skin is warm and dry.  Psychiatric: Mood and affect normal.    No results found for this or any previous visit (from the past 24 hour(s)).  No results found.   ASSESSMENT and PLAN  1. Adjustment disorder with mixed anxiety and depressed mood  Discussed treatment options. Patient would like to do trial of  medications. Discussed new medication r/se/b. Discussed titration of medication.   - sertraline (ZOLOFT) 50 MG tablet; Take 1 tablet (50 mg total) by mouth daily. - TSH   Return in about 6 weeks (around 03/25/2017).    Myles Lipps, MD Primary Care at [facility] [address] [place], [place] [address] Ph.  [phone] Fax [phone]

## 2017-02-12 LAB — TSH: TSH: 2.07 u[IU]/mL (ref 0.450–4.500)

## 2017-02-27 ENCOUNTER — Encounter: Payer: Self-pay | Admitting: Physician Assistant

## 2017-02-27 ENCOUNTER — Telehealth: Payer: Self-pay | Admitting: *Deleted

## 2017-02-27 ENCOUNTER — Ambulatory Visit (INDEPENDENT_AMBULATORY_CARE_PROVIDER_SITE_OTHER): Payer: 59 | Admitting: Physician Assistant

## 2017-02-27 VITALS — BP 120/80 | HR 68 | Temp 98.4°F | Resp 16 | Ht 64.75 in | Wt 198.2 lb

## 2017-02-27 DIAGNOSIS — H5711 Ocular pain, right eye: Secondary | ICD-10-CM

## 2017-02-27 DIAGNOSIS — S0501XA Injury of conjunctiva and corneal abrasion without foreign body, right eye, initial encounter: Secondary | ICD-10-CM

## 2017-02-27 MED ORDER — DICLOFENAC SODIUM 0.1 % OP SOLN: 1.0000 [drp] | Freq: Four times a day (QID) | OPHTHALMIC | 0 refills | Status: DC

## 2017-02-27 MED ORDER — POLYMYXIN B-TRIMETHOPRIM 10000-0.1 UNIT/ML-% OP SOLN: 1.0000 [drp] | Freq: Four times a day (QID) | OPHTHALMIC | 0 refills | Status: AC

## 2017-02-27 NOTE — Patient Instructions (Addendum)
Use your eye drops: 1 drop, four (4) times per day for five (5) days Come back tomorrow if your eye gets worse.  Come back and see me in 2 days to recheck.   Thank you for coming in today. I hope you feel we met your needs.  Feel free to call PCP if you have any questions or further requests.  Please consider signing up for MyChart if you do not already have it, as this is a great way to communicate with me.  Best,  Whitney McVey, PA-C   Abrasin corneal (Corneal Abrasion) La crnea es la cubierta transparente en la parte anterior y central del ojo. Cuando mira la parte colorida del ojo, est mirando a travs de la crnea. Es un tejido delgado formado por capas. La capa superior es la capa ms sensible. La abrasin corneal ocurre cuando esta capa sufre un rasguo o una lesin hace que se desprenda. CUIDADOS EN EL HOGAR  Pueden administrarle gotas o una crema con medicamento. Tome los [employer] le indique su mdico.  Es posible que le apliquen un parche de presin sobre el ojo. Si este es el caso, siga las instrucciones de su mdico respecto de cundo Engineer, manufacturing systems. No conduzca ni opere maquinaria mientras lleve puesto el parche. Es difcil juzgar las Geographical information systems officer.  Visite a su mdico para que le realice un examen de seguimiento, si as se lo indican. Es muy importante que cumpla con esta cita.  SOLICITE AYUDA SI:  Tree surgeon, tiene sensibilidad a la luz y tiene una sensacin de picazn en un ojo o en ambos.  Su parche de presin se afloja continuamente. Puede parpadear debajo del parche.  Supura lquido del ojo o los prpados se pegan por la [place].  Siente por la [employer] mismos sntomas que sinti con la primera abrasin. Esto podra [employer], semanas o meses despus de que se cur de la primera abrasin.  Esta informacin no tiene Marine scientist el consejo del mdico. Asegrese de hacerle al mdico cualquier pregunta que tenga. Document  Released: 06/09/2010 Document Revised: 01/26/2015 Document Reviewed: 01/12/2013 Elsevier Interactive Patient Education  2017 [employer].  IF you received an x-ray today, you will receive an invoice from [facility] Rad[facility]. Please contact [facility] at [phone] with questions or concerns regarding your invoice.   IF you received labwork today, you will receive an invoice from [place]. Please contact [facility] at [phone] with questions or concerns regarding your invoice.   Our billing staff will not be able to assist you with questions regarding bills from these companies.  You will be contacted with the lab results as soon as they are available. The fastest way to get your results is to activate your My Chart account. Instructions are located on the last page of this paperwork. If you have not heard from [place] regarding the results in 2 weeks, please contact this office.

## 2017-02-27 NOTE — Progress Notes (Signed)
   Travis Estrada  MRN: [id number] DOB: 1986-02-18  PCP: Patient, No Pcp Per  Subjective:  Pt is a 31 year old male who presents to clinic for eye problem. He was cutting down trees with a chainsaw yesterday. Did not feel anything in eye yesterday. This morning it is red, itchy and feels like there is something in there. Denies visual changes, fever.   Review of Systems  Eyes: Positive for pain, redness and itching. Negative for photophobia, discharge and visual disturbance.  Neurological: Negative for dizziness and headaches.    Patient Active Problem List   Diagnosis Date Noted  . Adjustment disorder with mixed anxiety and depressed mood 02/11/2017  . Chronic pain of right knee 11/07/2016    Current Outpatient Prescriptions on File Prior to Visit  Medication Sig Dispense Refill  . sertraline (ZOLOFT) 50 MG tablet Take 1 tablet (50 mg total) by mouth daily. 30 tablet 3   No current facility-administered medications on file prior to visit.     No Known Allergies   Objective:  BP 120/80   Pulse 68   Temp 98.4 F (36.9 C) (Oral)   Resp 16   Ht 5' 4.75" (1.645 m)   Wt 198 lb 3.2 oz (89.9 kg)   SpO2 97%   BMI 33.24 kg/m   Physical Exam  Constitutional: He is oriented to person, place, and time and well-developed, well-nourished, and in no distress. No distress.  Eyes: Pupils are equal, round, and reactive to light. Lids are everted and swept, no foreign bodies found. Right conjunctiva is injected. Left conjunctiva is not injected.    Fluorescence eye exam reveals inflamed annual abrasion anterior cornea with increased uptake. No drainage. No FB noted or felt with sweep of q-tip.   Neurological: He is alert and oriented to person, place, and time. GCS score is 15.  Skin: Skin is warm and dry.  Psychiatric: Mood, memory, affect and judgment normal.  Vitals reviewed.   Assessment and Plan :  1. Abrasion of right cornea, initial encounter 2. Right eye pain -  trimethoprim-polymyxin b (POLYTRIM) ophthalmic solution; Place 1 drop into the right eye every 6 (six) hours. For 5 days  Dispense: 10 mL; Refill: 0 - diclofenac (VOLTAREN) 0.1 % ophthalmic solution; Place 1 drop into the right eye 4 (four) times daily. For eye pain. Use this in addition to your antibiotic eye drops.  Dispense: 5 mL; Refill: 0 - No FB noted on physical exam. Suspect corneal abrasion, plan to treat with Polytrim and Voltaren for pain/inflamation. RTC in 2 days for recheck.   Marco Collie, PA-C  Primary Care at [facility] 02/27/2017 11:06 AM

## 2017-02-27 NOTE — Telephone Encounter (Signed)
called and cancelled appt for today with Dr. Laruth Bouchard, per Huntley Estelle, PA.

## 2017-08-19 ENCOUNTER — Encounter: Payer: Self-pay | Admitting: Physician Assistant

## 2017-09-09 ENCOUNTER — Ambulatory Visit (HOSPITAL_COMMUNITY)
Admission: EM | Admit: 2017-09-09 | Discharge: 2017-09-09 | Disposition: A | Discharge status: Home / Self Care | Payer: 59 | Attending: Family Medicine | Admitting: Family Medicine

## 2017-09-09 ENCOUNTER — Encounter (HOSPITAL_COMMUNITY): Payer: Self-pay | Admitting: Family Medicine

## 2017-09-09 DIAGNOSIS — R05 Cough: Secondary | ICD-10-CM | POA: Diagnosis not present

## 2017-09-09 DIAGNOSIS — R059 Cough, unspecified: Secondary | ICD-10-CM

## 2017-09-09 MED ORDER — BENZONATATE 100 MG PO CAPS: 100.0000 mg | ORAL_CAPSULE | Freq: Three times a day (TID) | ORAL | 0 refills | Status: DC

## 2017-09-09 MED ORDER — CETIRIZINE-PSEUDOEPHEDRINE ER 5-120 MG PO TB12: 1.0000 | ORAL_TABLET | Freq: Every day | ORAL | 0 refills | Status: DC

## 2017-09-09 MED ORDER — IPRATROPIUM BROMIDE 0.06 % NA SOLN: 2.0000 | Freq: Four times a day (QID) | NASAL | 0 refills | Status: DC

## 2017-09-09 MED ORDER — FLUTICASONE PROPIONATE 50 MCG/ACT NA SUSP: 2.0000 | Freq: Every day | NASAL | 0 refills | Status: DC

## 2017-09-09 NOTE — ED Provider Notes (Signed)
[facility]    CSN: [id number] Arrival date & time: 09/09/17  1341     History   Chief Complaint Chief Complaint  Patient presents with  . Cough    HPI Travis Estrada is a 32 y.o. male.   32 year old male comes in for 1 day history of URI symptoms.  Has had rhinorrhea, nasal congestion, cough, sore throat, sneezing.  Subjective fever this morning.  Has not taken anything for the symptoms.  Never smoker.     Past Medical History:  Diagnosis Date  . Pterygium     Patient Active Problem List   Diagnosis Date Noted  . Adjustment disorder with mixed anxiety and depressed mood 02/11/2017  . Chronic pain of right knee 11/07/2016    History reviewed. No pertinent surgical history.     Home Medications    Prior to Admission medications   Medication Sig Start Date End Date Taking? Authorizing Provider  benzonatate (TESSALON) 100 MG capsule Take 1 capsule (100 mg total) by mouth every 8 (eight) hours. 09/09/17   Cathie HoopsYu, Craigory Toste V, PA-C  cetirizine-pseudoephedrine (ZYRTEC-D) 5-120 MG tablet Take 1 tablet by mouth daily. 09/09/17   Cathie HoopsYu, Mortimer Bair V, PA-C  fluticasone (FLONASE) 50 MCG/ACT nasal spray Place 2 sprays into both nostrils daily. 09/09/17   Cathie HoopsYu, Jakaylee Sasaki V, PA-C  ipratropium (ATROVENT) 0.06 % nasal spray Place 2 sprays into both nostrils 4 (four) times daily. 09/09/17   Belinda FisherYu, Lilja Soland V, PA-C    Family History Family History  Problem Relation Age of Onset  . Diabetes Mother   . Hypertension Sister     Social History Social History   Tobacco Use  . Smoking status: Never Smoker  . Smokeless tobacco: Never Used  Substance Use Topics  . Alcohol use: Yes    Alcohol/week: 3.0 oz    Types: 5 Cans of beer per week  . Drug use: No     Allergies   Patient has no known allergies.   Review of Systems Review of Systems  Reason unable to perform ROS: See HPI as above.     Physical Exam Triage Vital Signs ED Triage Vitals [09/09/17 1424]  Enc Vitals Group     BP (!) 121/57      Pulse Rate 85     Resp 18     Temp 98.6 F (37 C)     Temp Source Oral     SpO2 98 %     Weight      Height      Head Circumference      Peak Flow      Pain Score      Pain Loc      Pain Edu?      Excl. in GC?    No data found.  Updated Vital Signs BP (!) 121/57   Pulse 85   Temp 98.6 F (37 C) (Oral)   Resp 18   SpO2 98%   Physical Exam  Constitutional: He is oriented to person, place, and time. He appears well-developed and well-nourished. No distress.  HENT:  Head: Normocephalic and atraumatic.  Right Ear: External ear and ear canal normal. Tympanic membrane is erythematous. Tympanic membrane is not bulging.  Left Ear: External ear and ear canal normal. Tympanic membrane is erythematous. Tympanic membrane is not bulging.  Nose: Mucosal edema and rhinorrhea present. Right sinus exhibits no maxillary sinus tenderness and no frontal sinus tenderness. Left sinus exhibits no maxillary sinus tenderness and no frontal sinus tenderness.  Mouth/Throat: Uvula is midline, oropharynx is clear and moist and mucous membranes are normal.  Eyes: Pupils are equal, round, and reactive to light. Conjunctivae are normal.  Neck: Normal range of motion. Neck supple.  Cardiovascular: Normal rate, regular rhythm and normal heart sounds. Exam reveals no gallop and no friction rub.  No murmur heard. Pulmonary/Chest: Effort normal and breath sounds normal. He has no decreased breath sounds. He has no wheezes. He has no rhonchi. He has no rales.  Lymphadenopathy:    He has no cervical adenopathy.  Neurological: He is alert and oriented to person, place, and time.  Skin: Skin is warm and dry. He is not diaphoretic.  Psychiatric: He has a normal mood and affect. His behavior is normal. Judgment normal.     UC Treatments / Results  Labs (all labs ordered are listed, but only abnormal results are displayed) Labs Reviewed - No data to display  EKG None Radiology No results  found.  Procedures Procedures (including critical care time)  Medications Ordered in UC Medications - No data to display   Initial Impression / Assessment and Plan / UC Course  I have reviewed the triage vital signs and the nursing notes.  Pertinent labs & imaging results that were available during my care of the patient were reviewed by me and considered in my medical decision making (see chart for details).    Discussed with patient history and exam most consistent with seasonal allergies vs viral URI. Symptomatic treatment as needed. Push fluids. Return precautions given.   Final Clinical Impressions(s) / UC Diagnoses   Final diagnoses:  Cough    ED Discharge Orders        Ordered    benzonatate (TESSALON) 100 MG capsule  Every 8 hours     09/09/17 1449    cetirizine-pseudoephedrine (ZYRTEC-D) 5-120 MG tablet  Daily     09/09/17 1449    fluticasone (FLONASE) 50 MCG/ACT nasal spray  Daily     09/09/17 1449    ipratropium (ATROVENT) 0.06 % nasal spray  4 times daily     09/09/17 1449        Belinda Fisher, PA-C 09/09/17 1453

## 2017-09-09 NOTE — ED Triage Notes (Signed)
Pt here for cough since yesterday and chest pain with coughing

## 2017-09-09 NOTE — Discharge Instructions (Signed)
Tessalon for cough. Start flonase, atrovent nasal spray, zyrtec-D for nasal congestion/drainage. You can use over the counter nasal saline rinse such as neti pot for nasal congestion. Keep hydrated, your urine should be clear to pale yellow in color. Tylenol/motrin for fever and pain. Monitor for any worsening of symptoms, chest pain, shortness of breath, wheezing, swelling of the throat, follow up for reevaluation.  ° °For sore throat try using a honey-based tea. Use 3 teaspoons of honey with juice squeezed from half lemon. Place shaved pieces of ginger into 1/2-1 cup of water and warm over stove top. Then mix the ingredients and repeat every 4 hours as needed. ° °

## 2017-12-04 ENCOUNTER — Other Ambulatory Visit: Payer: Self-pay

## 2017-12-04 ENCOUNTER — Encounter: Payer: Self-pay | Admitting: Physician Assistant

## 2017-12-04 ENCOUNTER — Ambulatory Visit: Payer: 59 | Admitting: Physician Assistant

## 2017-12-04 VITALS — BP 123/78 | HR 71 | Temp 98.1°F | Resp 16 | Ht 65.0 in | Wt 191.6 lb

## 2017-12-04 DIAGNOSIS — R7303 Prediabetes: Secondary | ICD-10-CM | POA: Insufficient documentation

## 2017-12-04 DIAGNOSIS — R112 Nausea with vomiting, unspecified: Secondary | ICD-10-CM | POA: Diagnosis not present

## 2017-12-04 DIAGNOSIS — R42 Dizziness and giddiness: Secondary | ICD-10-CM

## 2017-12-04 LAB — POCT CBC
Granulocyte percent: 66.2 %G (ref 37–80)
HCT, POC: 45.8 % (ref 43.5–53.7)
Hemoglobin: 13.9 g/dL — AB (ref 14.1–18.1)
Lymph, poc: 1.9 (ref 0.6–3.4)
MCH, POC: 27.6 pg (ref 27–31.2)
MCHC: 30.3 g/dL — AB (ref 31.8–35.4)
MCV: 91.1 fL (ref 80–97)
MID (cbc): 0.2 (ref 0–0.9)
MPV: 9 fL (ref 0–99.8)
POC Granulocyte: 6 (ref 2–6.9)
POC LYMPH PERCENT: 30.6 %L (ref 10–50)
POC MID %: 3.2 %M (ref 0–12)
Platelet Count, POC: 236 10*3/uL (ref 142–424)
RBC: 5.03 M/uL (ref 4.69–6.13)
RDW, POC: 13.6 %
WBC: 6.1 10*3/uL (ref 4.6–10.2)

## 2017-12-04 LAB — POCT URINALYSIS DIP (MANUAL ENTRY)
Bilirubin, UA: NEGATIVE
Glucose, UA: NEGATIVE mg/dL
Ketones, POC UA: NEGATIVE mg/dL
Leukocytes, UA: NEGATIVE
Nitrite, UA: NEGATIVE
Protein Ur, POC: NEGATIVE mg/dL
Spec Grav, UA: 1.025 (ref 1.010–1.025)
Urobilinogen, UA: 0.2 E.U./dL
pH, UA: 6 (ref 5.0–8.0)

## 2017-12-04 LAB — POCT GLYCOSYLATED HEMOGLOBIN (HGB A1C): Hemoglobin A1C: 6.2 % — AB (ref 4.0–5.6)

## 2017-12-04 LAB — GLUCOSE, POCT (MANUAL RESULT ENTRY): POC Glucose: 80 mg/dl (ref 70–99)

## 2017-12-04 MED ORDER — METFORMIN HCL 500 MG PO TABS: 500.0000 mg | ORAL_TABLET | Freq: Every day | ORAL | 3 refills | Status: DC

## 2017-12-04 NOTE — Patient Instructions (Addendum)
Dosis de metformina (a tomar con alimentos) Semana 1: tomar 1/2 comprimido dos veces al da. Semana 2: tomar 1 comprimido por la maana.   volver y verme en 3-4 semanas ------------------------------------------------------------------- Metformin Dosing (to be taken with food) Week 1: take 1/2 tablet twice a day. Week 2: take 1 tablet in the morning.    Plan de alimentacin para la prediabetes (Prediabetes Eating Plan) La prediabetes, tambin llamada intolerancia a la glucosa o alteracin de la glucosa en ayunas, es una afeccin que eleva los niveles de azcar en la sangre (glucemia) por encima de lo normal. Seguir una dieta saludable puede ayudar a mantener la prediabetes bajo control, y tambin reduce el riesgo de tener diabetes tipo2 y cardiopata, que es ms alto en las personas que tienen esta afeccin. Junto con la actividad fsica habitual, una dieta saludable:  Promueve la prdida de Richmondpeso.  Ayuda a Medical sales representativecontrolar los niveles de Bankerazcar en la sangre.  Ayuda a mejorar la forma en que el organismo Botswanausa la insulina. QU DEBO SABER ACERCA DE ESTE PLAN DE ALIMENTACIN?  Use el ndice glucmico (IG) para planificar las comidas. El ndice le informa con qu rapidez un alimento elevar su nivel de azcar en la sangre. Elija los alimentos con bajo IG. Estos tardan ms tiempo en subir el nivel de azcar en la sangre.  Preste mucha atencin a la cantidad de hidratos de carbono que hay en los alimentos que consume. Los hidratos de carbono OfficeMax Incorporatedaumentan los niveles de Bankerazcar en la sangre.  Lleve un registro de la cantidad de caloras que ingiere. Ingerir la cantidad correcta de caloras lo ayudar a Cabin crewalcanzar un peso saludable. Bajar alrededor del 7por ciento del peso inicial puede ayudar a Automotive engineerevitar la diabetes tipo2.  Tal vez deba seguir Clinical cytogeneticistuna dieta mediterrnea. Esta incluye una gran cantidad de verduras, carnes magras o pescado, cereales integrales, frutas, as como aceites y grasas saludables.  QU  ALIMENTOS PUEDO COMER? Cereales Cereales integrales, como panes, galletas, cereales y pastas de salvado o integrales. Avena sin azcar. Trigo burgol. Cebada. Quinua. Arroz integral. Tortillas o tacos de harina de maz o de salvado. Hoover BrunetteVerduras Deatra JamesLechuga. Espinaca. Guisantes. Remolachas. Coliflor. Repollo. Brcoli. Zanahorias. Tomates. Calabaza. Christella NoaBerenjena. Hierbas. Pimientos. Cebollas. Pepinos. Repollitos de Bruselas. Frutas Frutos rojos. Bananas. Manzanas. Naranjas. Uvas. Papaya. Mango. Granada. Kiwi. Pomelo. Cerezas. Carnes y otras fuentes de protenas Mariscos. Carnes Emdenmagras, entre ellas, pollo y Kingstreepavo o cortes magros de carne de cerdo y de [place] Lewisvaca. Tofu. Huevos. Los frutos secos. Frijoles. Lcteos Productos lcteos descremados o semidescremados, como yogur, queso cottage y Emlentonqueso. CHS IncBebidas Agua. T. Caf. Gaseosas sin azcar o dietticas. Agua de PlumwoodSeltz. Leche. Productos alternativos de la [place] Colonyleche, [address]como leche de soja o de Moorefieldalmendra. Condimentos Mostaza. Salsa de pepinillos. [place] con bajo contenido de [place]the [place] of 60 deg [place]grasa y de International aid/development workerazcar. Salsa barbacoa con bajo contenido de grasa y de azcar. Mayonesa sin grasa o con bajo contenido de St. Francisgrasa. Dulces y postres Budines sin azcar o con bajo contenido de Stockettgrasa. Helados y otros dulces congelados sin azcar o con bajo contenido de Kitzmillergrasa. Grasas y Arts development officeraceites Aguacate. Nueces. Aceite de oliva. Los artculos mencionados arriba pueden no ser Raytheonuna lista completa de las bebidas o los alimentos recomendados. Comunquese con el nutricionista para conocer ms opciones. QU ALIMENTOS NO SE RECOMIENDAN? Cereales Productos a base de Kenyaharina y de Madagascarharina blanca refinada, como panes, pastas, bocadillos y cereales. Bebidas Bebidas azucaradas, como t helado y gaseosas con International aid/development workerazcar. Dulces y [address] Productos de Bloomfieldpanadera, como tortas, Connellsvillemadalenas, pasteles, Programmer, systemsgalletitas y tarta de  queso. Los artculos mencionados arriba pueden no ser Raytheon de las bebidas y los alimentos que se Insurance account manager. Comunquese con el nutricionista para obtener ms informacin. Esta informacin no tiene Theme park manager el consejo del mdico. Asegrese de hacerle al mdico cualquier pregunta que tenga. Document Released: 01/26/2015 Document Revised: 01/26/2015 Document Reviewed: 06/02/2014 Elsevier Interactive Patient Education  2017 [employer].

## 2017-12-04 NOTE — Progress Notes (Signed)
Travis BrackettRene Estrada  MRN: [id number] DOB: 05/05/1986  PCP: Patient, No Pcp Per  Subjective:  Pt is a 32 year old male who presents to clinic for fatigue x 2 months.  Happens a few times a week. "I don't feel power at all. Feels like I might fall down". A few episodes of vomiting. Sitting does not make it better. Episode lasts a few hours.  Happens after lunch. Feels okay before lunch Works at a Social research officer, governmentwarehouse loading furnature.  He drinks a lot of water daily - about  Feels thirsty a lot. Wakes up once/night to urinate.  Diet: chicken, rice, beans. Chinese foods makes him feel really bad.  FHx: grandmother died from DM, uncle, maybe his mother.   Review of Systems  Gastrointestinal: Positive for nausea and vomiting. Negative for abdominal pain and diarrhea.  Endocrine: Positive for polydipsia. Negative for polyphagia and polyuria.  Neurological: Negative for dizziness, numbness and headaches.    Patient Active Problem List   Diagnosis Date Noted  . Adjustment disorder with mixed anxiety and depressed mood 02/11/2017  . Chronic pain of right knee 11/07/2016    Current Outpatient Medications on File Prior to Visit  Medication Sig Dispense Refill  . cetirizine-pseudoephedrine (ZYRTEC-D) 5-120 MG tablet Take 1 tablet by mouth daily. (Patient not taking: Reported on 12/04/2017) 15 tablet 0  . fluticasone (FLONASE) 50 MCG/ACT nasal spray Place 2 sprays into both nostrils daily. (Patient not taking: Reported on 12/04/2017) 1 g 0  . ipratropium (ATROVENT) 0.06 % nasal spray Place 2 sprays into both nostrils 4 (four) times daily. (Patient not taking: Reported on 12/04/2017) 15 mL 0   No current facility-administered medications on file prior to visit.     No Known Allergies   Objective:  BP 123/78 (BP Location: Right Arm, Patient Position: Sitting, Cuff Size: Normal)   Pulse 71   Temp 98.1 F (36.7 C) (Oral)   Resp 16   Ht 5\' 5"  (1.651 m)   Wt 191 lb 9.6 oz (86.9 kg)   SpO2 100%   BMI 31.88  kg/m  Orthostatic VS for the past 24 hrs:  BP- Lying Pulse- Lying BP- Sitting Pulse- Sitting BP- Standing at 0 minutes Pulse- Standing at 0 minutes  12/04/17 1441 115/72 59 123/78 61 114/77 75   Physical Exam  Constitutional: He is oriented to person, place, and time. He appears well-developed and well-nourished.  Cardiovascular: Normal rate and regular rhythm.  Pulmonary/Chest: Effort normal. No respiratory distress.  Neurological: He is alert and oriented to person, place, and time.  Skin: Skin is warm and dry.  Psychiatric: He has a normal mood and affect. His behavior is normal. Judgment and thought content normal.  Vitals reviewed.  Results for orders placed or performed in visit on 12/04/17  POCT urinalysis dipstick  Result Value Ref Range   Color, UA yellow yellow   Clarity, UA clear clear   Glucose, UA negative negative mg/dL   Bilirubin, UA negative negative   Ketones, POC UA negative negative mg/dL   Spec Grav, UA 4.0981.025 1.1911.010 - 1.025   Blood, UA trace-intact (A) negative   pH, UA 6.0 5.0 - 8.0   Protein Ur, POC negative negative mg/dL   Urobilinogen, UA 0.2 0.2 or 1.0 E.U./dL   Nitrite, UA Negative Negative   Leukocytes, UA Negative Negative  POCT CBC  Result Value Ref Range   WBC 6.1 4.6 - 10.2 K/uL   Lymph, poc 1.9 0.6 - 3.4   POC LYMPH PERCENT  30.6 10 - 50 %L   MID (cbc) 0.2 0 - 0.9   POC MID % 3.2 0 - 12 %M   POC Granulocyte 6.0 2 - 6.9   Granulocyte percent 66.2 37 - 80 %G   RBC 5.03 4.69 - 6.13 M/uL   Hemoglobin 13.9 (A) 14.1 - 18.1 g/dL   HCT, POC 16.1 09.6 - 53.7 %   MCV 91.1 80 - 97 fL   MCH, POC 27.6 27 - 31.2 pg   MCHC 30.3 (A) 31.8 - 35.4 g/dL   RDW, POC 04.5 %   Platelet Count, POC 236 142 - 424 K/uL   MPV 9.0 0 - 99.8 fL  POCT glucose (manual entry)  Result Value Ref Range   POC Glucose 80 70 - 99 mg/dl  POCT glycosylated hemoglobin (Hb A1C)  Result Value Ref Range   Hemoglobin A1C 6.2 (A) 4.0 - 5.6 %   HbA1c POC (<> result, manual entry)   4.0 - 5.6 %   HbA1c, POC (prediabetic range)  5.7 - 6.4 %   HbA1c, POC (controlled diabetic range)  0.0 - 7.0 %   Assessment and Plan :  1. Prediabetes - Pt presents for nausea and lightheadedness x a few weeks. A1C is 6.2.  Suspect elevated blood sugar as the root of his problem.  Plan to treat with metformin 500 mg once daily.  Side effect profile and titration schedule discussed with patient.  Return to clinic in 3 to 4 weeks for recheck.  Discussed and printed out prediabetes eating plan. - metFORMIN (GLUCOPHAGE) 500 MG tablet; Take 1 tablet (500 mg total) by mouth daily with breakfast.  Dispense: 90 tablet; Refill: 3  2. Lightheadedness - POCT urinalysis dipstick - POCT CBC - POCT glucose (manual entry) - POCT glycosylated hemoglobin (Hb A1C)   Marco Collie, PA-C  Primary Care at [facility] 12/04/2017 2:50 PM

## 2017-12-25 ENCOUNTER — Ambulatory Visit: Payer: 59 | Admitting: Physician Assistant

## 2018-05-05 IMAGING — DX DG LUMBAR SPINE COMPLETE 4+V
5 series · 5 of 5 positions shown · non-contrast
Comparison: No recent prior.

CLINICAL DATA: Low back pain with sciatica.

EXAM:
LUMBAR SPINE - COMPLETE 4+ VIEW

[l-spine ap]
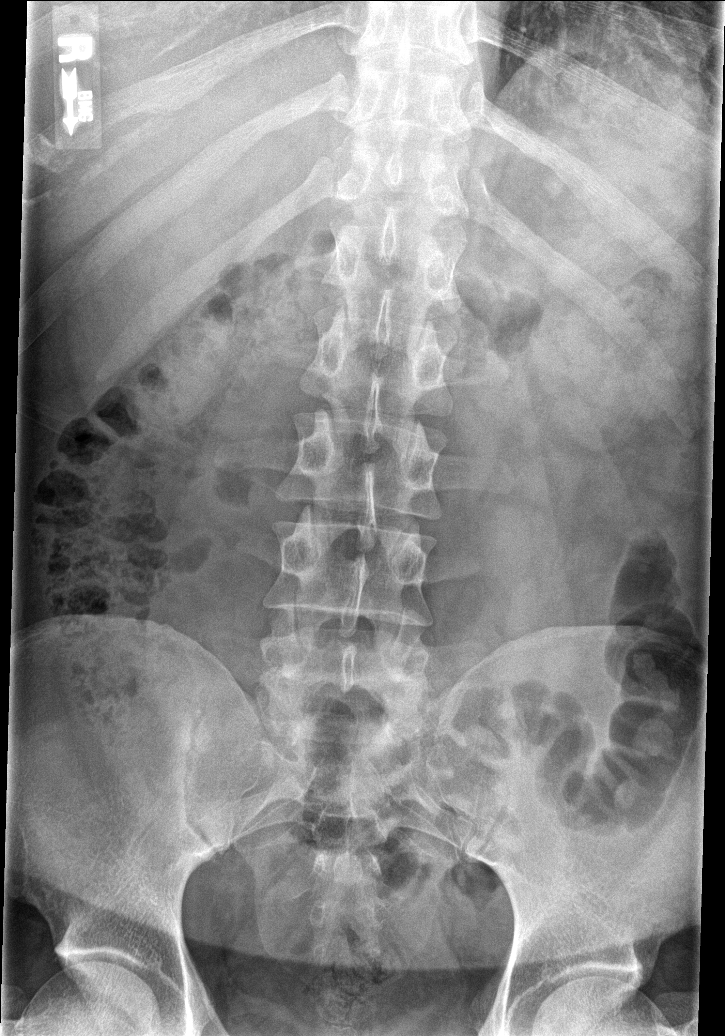

[l-spine obl (1 of 2)]
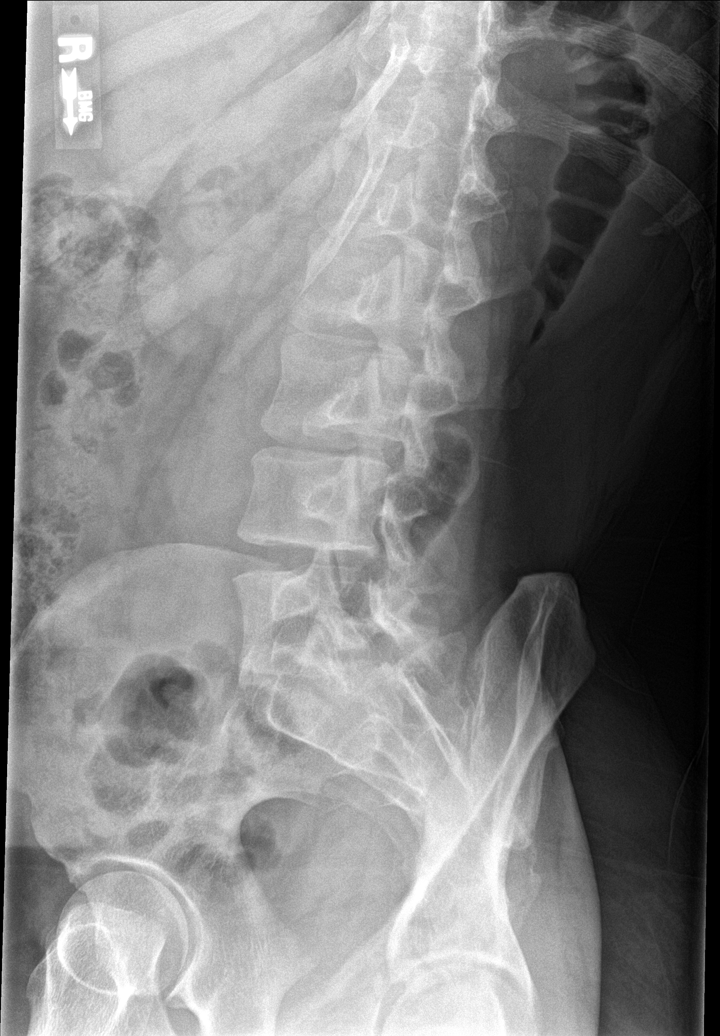

[l-spine obl (2 of 2)]
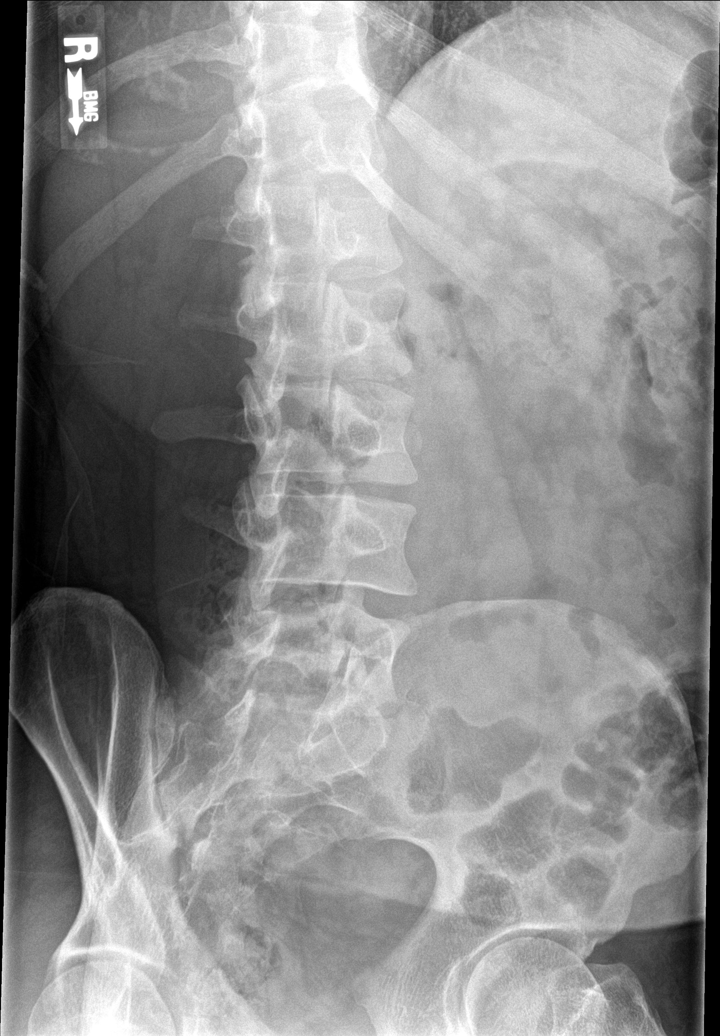

[l-spine lat]
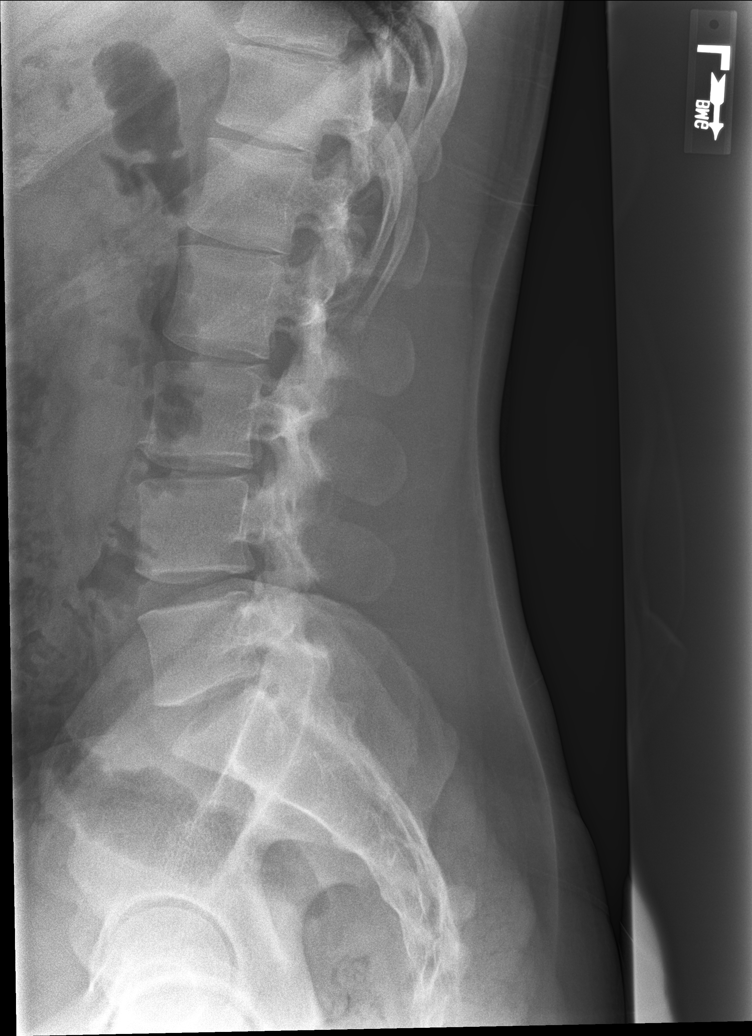

[l-spine l5-s1]
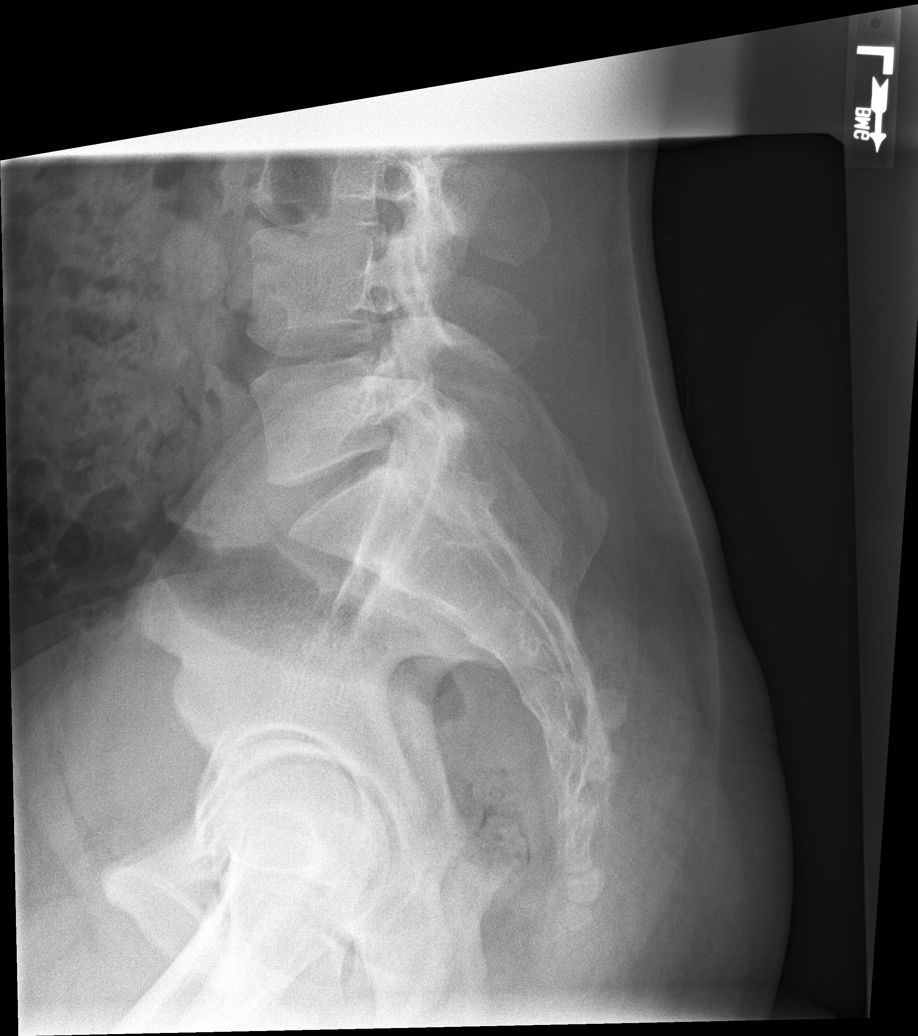

[5 of 5 positions shown; findings below may reference images not displayed]

FINDINGS: No acute bony abnormality identified. Normal alignment and
mineralization. No evidence of fracture. Mild degenerative changes
L3-L4. Soft tissues are unremarkable .
IMPRESSION: No acute identified.  Mild degenerative changes L3-L4.

## 2018-05-26 IMAGING — DX DG TIBIA/FIBULA 2V*R*
2 series · 2 of 2 positions shown · non-contrast
Comparison: None in PACs

CLINICAL DATA: Four weeks of right thigh pain worse when lying down
and sitting.

EXAM:
RIGHT TIBIA AND FIBULA - 2 VIEW

[tibia ap]
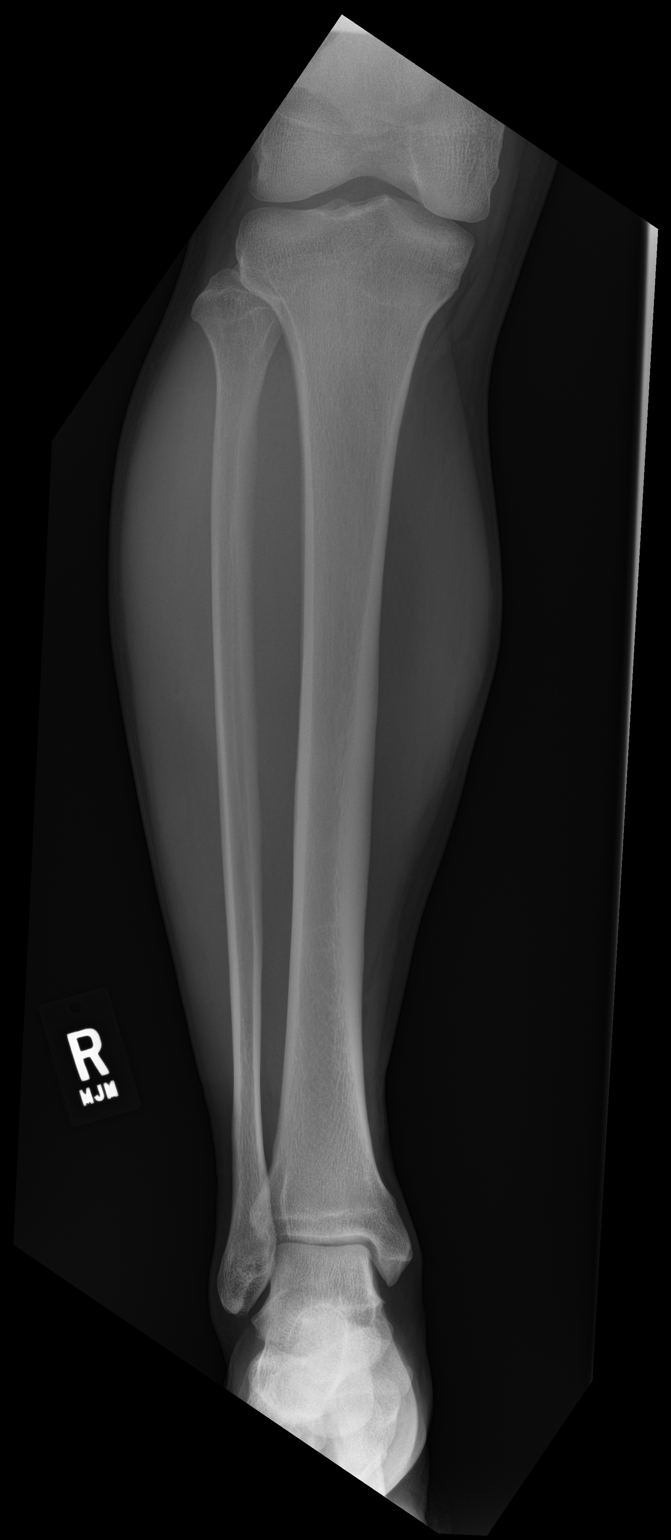

[tibia lat]
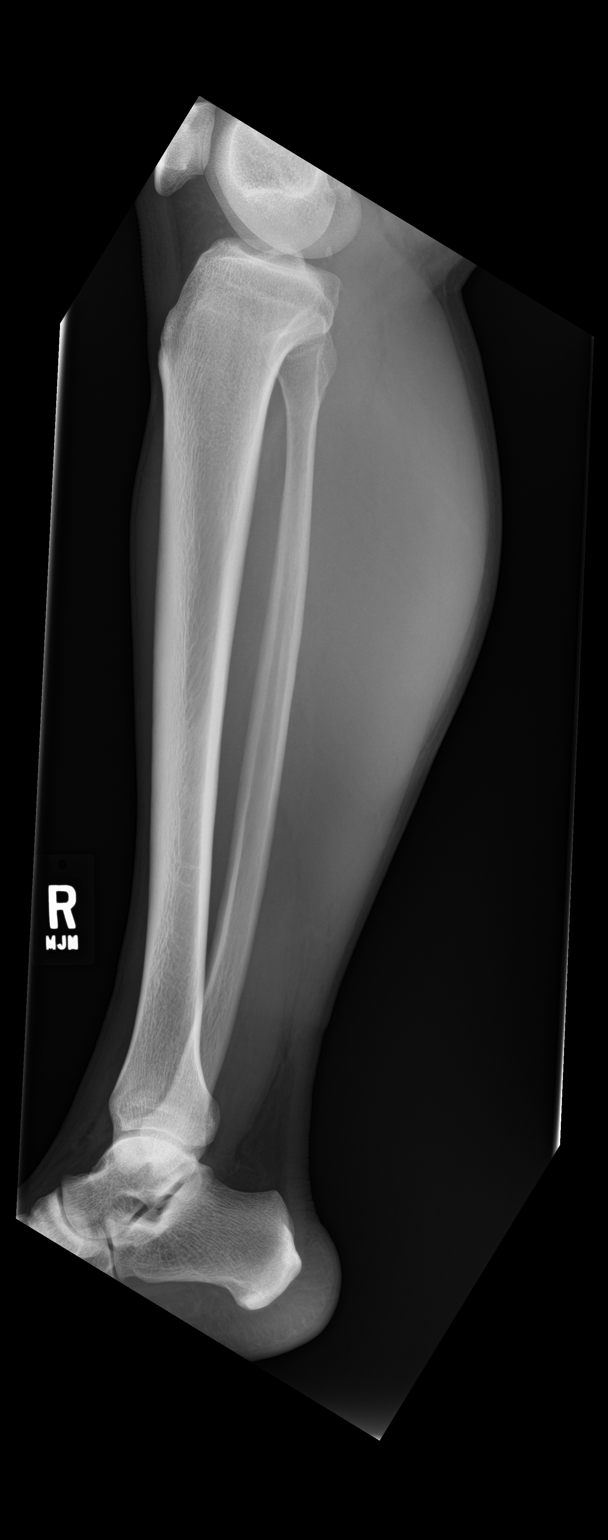

[2 of 2 positions shown; findings below may reference images not displayed]

FINDINGS: The tibia and fibula are adequately mineralized. There is no lytic
or blastic lesion. There is no periosteal reaction. The observed
portions of the knee and ankle are unremarkable. The soft tissues
are normal.
IMPRESSION: There is no acute or chronic bony abnormality of the right tibia or
fibula.

## 2018-05-26 IMAGING — DX DG FEMUR 2+V*R*
4 series · 4 of 4 positions shown · non-contrast
Comparison: None in PACs

CLINICAL DATA: Four weeks of right thigh pain unresponsive to
treatment for sciatica. The patient pain is worse when lying down
and seeding.

EXAM:
RIGHT FEMUR 2 VIEWS

[femur ap (1 of 2)]
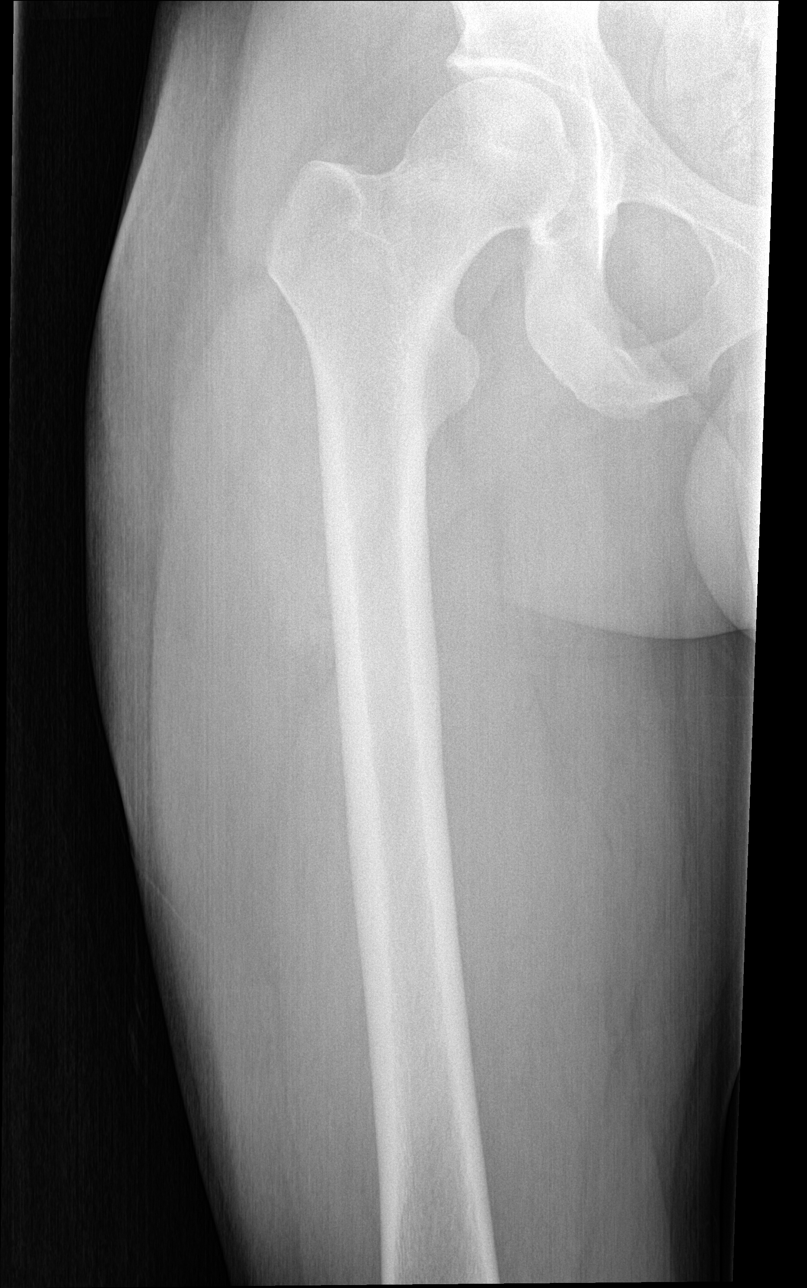

[femur ap (2 of 2)]
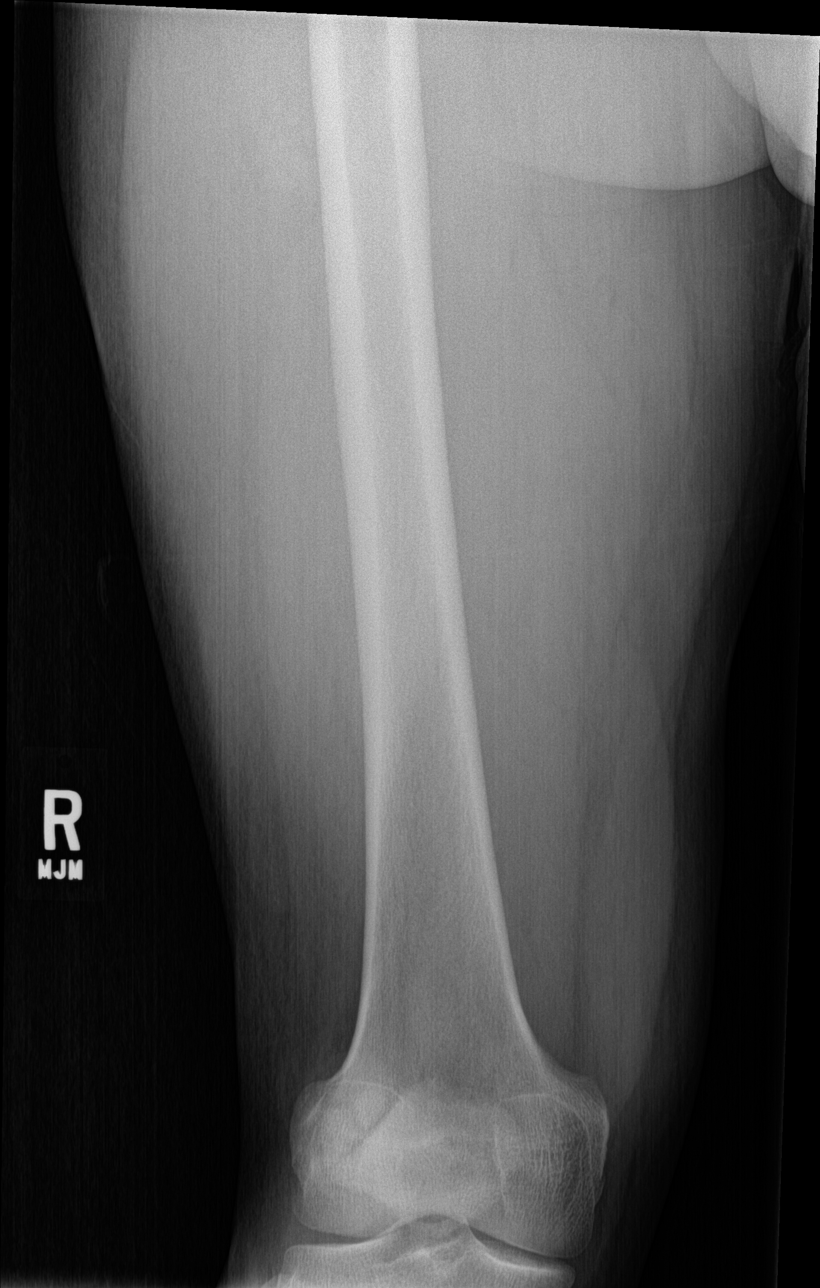

[femur lat (1 of 2)]
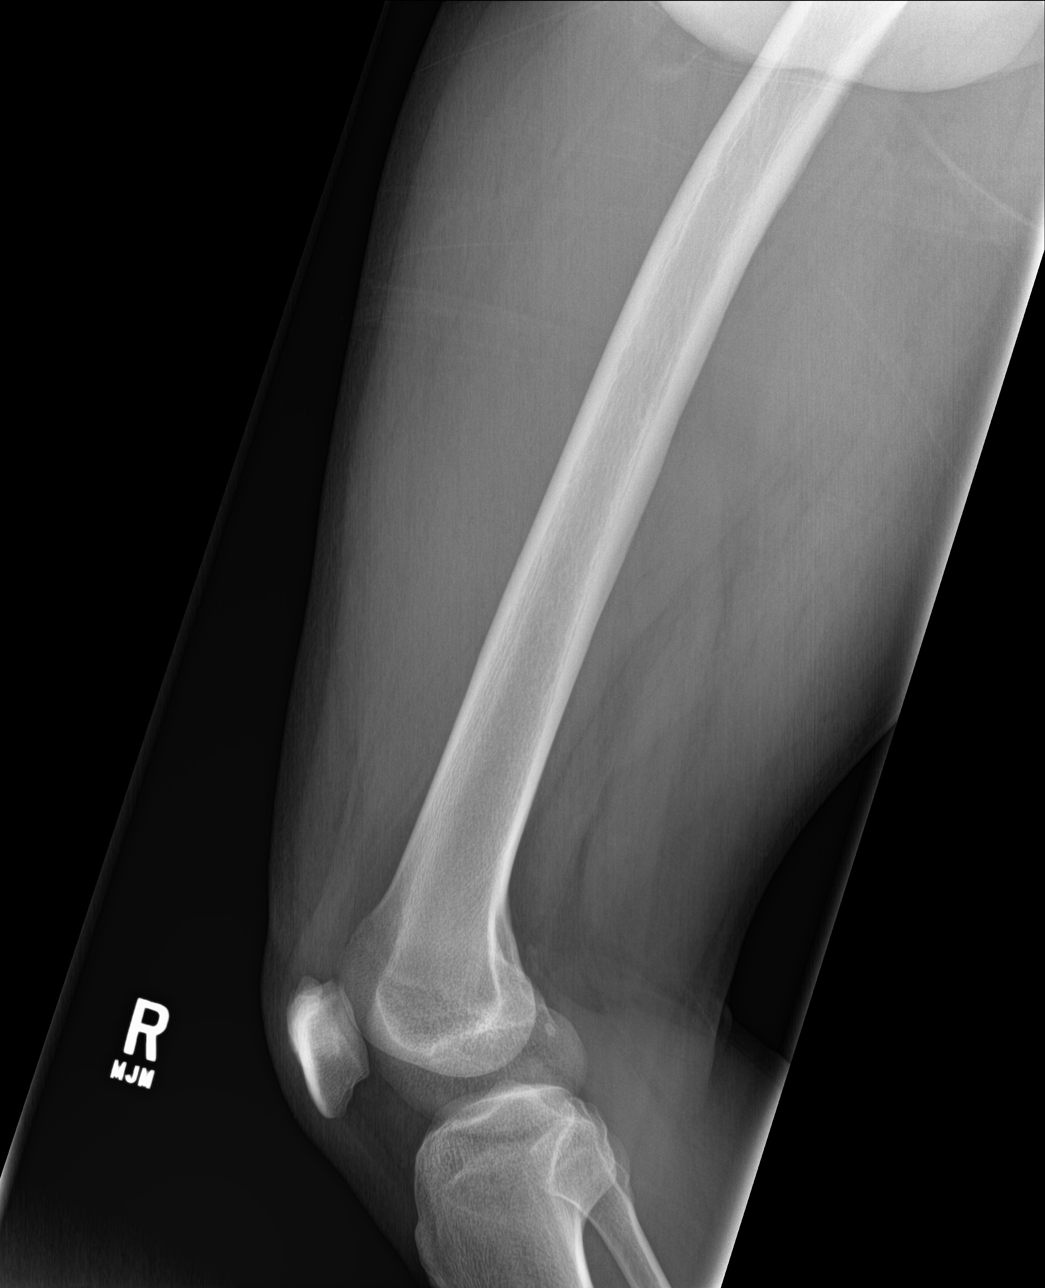

[femur lat (2 of 2)]
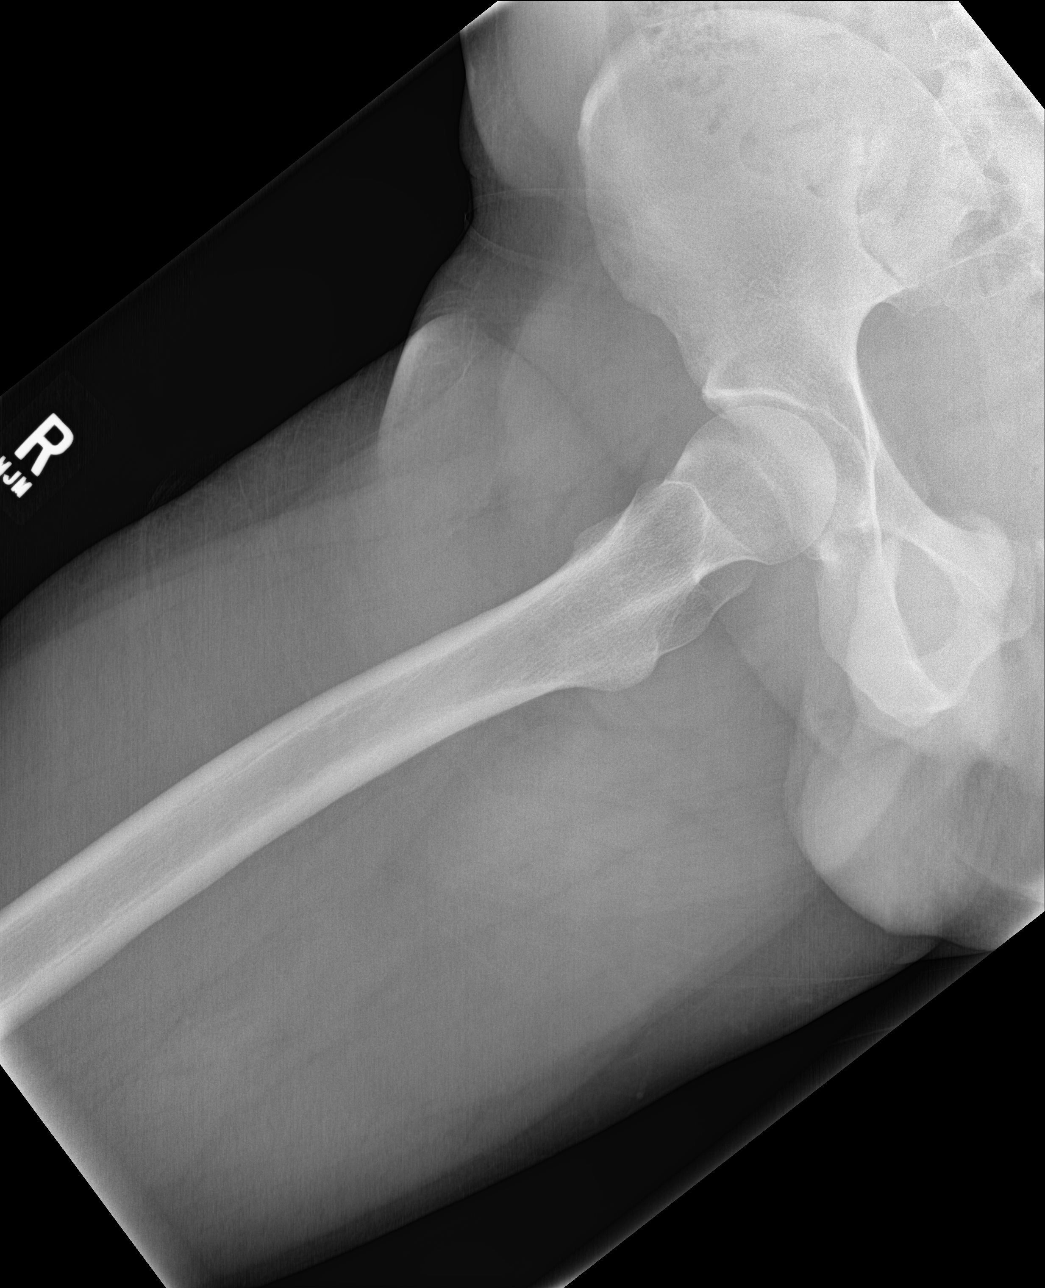

[4 of 4 positions shown; findings below may reference images not displayed]

FINDINGS: The right femur is adequately mineralized. There is no lytic nor
blastic lesion. There is no periosteal reaction. The observed
portions of the hip and knee joints are unremarkable. A bipartite
patella is suspected. The soft tissues of the thigh are
unremarkable.
IMPRESSION: There is no acute bony abnormality of the right femur. A bipartite
patella is suspected.

## 2018-07-07 ENCOUNTER — Emergency Department (HOSPITAL_COMMUNITY)
Admission: EM | Admit: 2018-07-07 | Discharge: 2018-07-07 | Disposition: A | Discharge status: Home / Self Care | Payer: 59 | Attending: Emergency Medicine | Admitting: Emergency Medicine

## 2018-07-07 ENCOUNTER — Encounter (HOSPITAL_COMMUNITY): Payer: Self-pay

## 2018-07-07 DIAGNOSIS — M791 Myalgia, unspecified site: Secondary | ICD-10-CM

## 2018-07-07 DIAGNOSIS — R112 Nausea with vomiting, unspecified: Secondary | ICD-10-CM | POA: Insufficient documentation

## 2018-07-07 LAB — CBC
HCT: 43.1 % (ref 39.0–52.0)
Hemoglobin: 14.3 g/dL (ref 13.0–17.0)
MCH: 29.1 pg (ref 26.0–34.0)
MCHC: 33.2 g/dL (ref 30.0–36.0)
MCV: 87.8 fL (ref 80.0–100.0)
Platelets: 289 10*3/uL (ref 150–400)
RBC: 4.91 MIL/uL (ref 4.22–5.81)
RDW: 13 % (ref 11.5–15.5)
WBC: 6.6 10*3/uL (ref 4.0–10.5)
nRBC: 0 % (ref 0.0–0.2)

## 2018-07-07 LAB — COMPREHENSIVE METABOLIC PANEL
ALT: 30 U/L (ref 0–44)
AST: 28 U/L (ref 15–41)
Albumin: 4.2 g/dL (ref 3.5–5.0)
Alkaline Phosphatase: 69 U/L (ref 38–126)
Anion gap: 11 (ref 5–15)
BUN: 15 mg/dL (ref 6–20)
CO2: 26 mmol/L (ref 22–32)
Calcium: 9.2 mg/dL (ref 8.9–10.3)
Chloride: 102 mmol/L (ref 98–111)
Creatinine, Ser: 1.02 mg/dL (ref 0.61–1.24)
GFR calc Af Amer: >60 mL/min (ref >60)
GFR calc non Af Amer: >60 mL/min (ref >60)
Glucose, Bld: 105 mg/dL — ABNORMAL HIGH (ref 70–99)
Potassium: 3.6 mmol/L (ref 3.5–5.1)
Sodium: 139 mmol/L (ref 135–145)
Total Bilirubin: 1.2 mg/dL (ref 0.3–1.2)
Total Protein: 7.5 g/dL (ref 6.5–8.1)

## 2018-07-07 LAB — URINALYSIS, ROUTINE W REFLEX MICROSCOPIC
Bilirubin Urine: NEGATIVE
Glucose, UA: NEGATIVE mg/dL
Hgb urine dipstick: NEGATIVE
Ketones, ur: NEGATIVE mg/dL
Leukocytes,Ua: NEGATIVE
Nitrite: NEGATIVE
Protein, ur: NEGATIVE mg/dL
Specific Gravity, Urine: 1.031 — ABNORMAL HIGH (ref 1.005–1.030)
pH: 6 (ref 5.0–8.0)

## 2018-07-07 LAB — CK: Total CK: 221 U/L (ref 49–397)

## 2018-07-07 LAB — LIPASE, BLOOD: Lipase: 27 U/L (ref 11–51)

## 2018-07-07 MED ORDER — IBUPROFEN 800 MG PO TABS
800.0000 mg | ORAL_TABLET | Freq: Once | ORAL | Status: DC
  Filled 2018-07-07: qty 1

## 2018-07-07 MED ORDER — DICYCLOMINE HCL 20 MG PO TABS: 20.0000 mg | ORAL_TABLET | Freq: Three times a day (TID) | ORAL | 0 refills | Status: DC | PRN

## 2018-07-07 MED ORDER — SODIUM CHLORIDE 0.9% FLUSH
3.0000 mL | Freq: Once | INTRAVENOUS | Status: AC
  Administered 2018-07-07: 3 mL via INTRAVENOUS

## 2018-07-07 MED ORDER — ONDANSETRON 4 MG PO TBDP: 4.0000 mg | ORAL_TABLET | Freq: Three times a day (TID) | ORAL | 0 refills | Status: DC | PRN

## 2018-07-07 MED ORDER — SODIUM CHLORIDE 0.9 % IV BOLUS
1000.0000 mL | Freq: Once | INTRAVENOUS | Status: AC
  Administered 2018-07-07: 1000 mL via INTRAVENOUS

## 2018-07-07 MED ORDER — ONDANSETRON 4 MG PO TBDP
4.0000 mg | ORAL_TABLET | Freq: Once | ORAL | Status: AC | PRN
  Administered 2018-07-07: 4 mg via ORAL
  Filled 2018-07-07: qty 1

## 2018-07-07 NOTE — ED Notes (Signed)
Patient verbalizes understanding of discharge instructions. Opportunity for questioning and answers were provided. Armband removed by staff, pt discharged from ED ambulatory.   

## 2018-07-07 NOTE — ED Provider Notes (Signed)
Emergency Department Provider Note   I have reviewed the triage vital signs and the nursing notes.   HISTORY  Chief Complaint Generalized Body Aches and Nausea   HPI Travis Estrada is a 33 y.o. male with PMH of prediabetes presents emergency department for evaluation of diffuse muscle cramping with intermittent nausea and vomiting.  Patient denies specific abdominal pain, chest pain, shortness of breath.  Patient describing diffuse myalgias.  No new medications.  No fevers.  Patient denies flulike symptoms.  Last episode of vomiting was yesterday.  He is eating and drinking well.  No radiation of symptoms or other modifying factors.  Past Medical History:  Diagnosis Date  . Pterygium     Patient Active Problem List   Diagnosis Date Noted  . Prediabetes 12/04/2017  . Adjustment disorder with mixed anxiety and depressed mood 02/11/2017  . Chronic pain of right knee 11/07/2016    History reviewed. No pertinent surgical history.  Allergies Patient has no known allergies.  Family History  Problem Relation Age of Onset  . Diabetes Mother   . Hypertension Sister     Social History Social History   Tobacco Use  . Smoking status: Never Smoker  . Smokeless tobacco: Never Used  Substance Use Topics  . Alcohol use: Yes    Alcohol/week: 5.0 standard drinks    Types: 5 Cans of beer per week  . Drug use: No    Review of Systems  Constitutional: No fever/chills Eyes: No visual changes. ENT: No sore throat. Cardiovascular: Denies chest pain. Respiratory: Denies shortness of breath. Gastrointestinal: Negative abdominal pain. Positive nausea and vomiting.  No diarrhea.  No constipation. Genitourinary: Negative for dysuria. Musculoskeletal: Positive diffuse myalgias.  Skin: Negative for rash. Neurological: Negative for headaches, focal weakness or numbness.  10-point ROS otherwise negative.  ____________________________________________   PHYSICAL EXAM:  VITAL  SIGNS: ED Triage Vitals  Enc Vitals Group     BP 07/07/18 1330 128/84     Pulse Rate 07/07/18 1330 76     Resp 07/07/18 1330 16     Temp 07/07/18 1330 98.3 F (36.8 C)     Temp Source 07/07/18 1330 Oral     SpO2 07/07/18 1330 99 %     Weight 07/07/18 1330 145 lb (65.8 kg)     Height 07/07/18 1330 5\' 7"  (1.702 m)     Pain Score 07/07/18 1328 5   Constitutional: Alert and oriented. Well appearing and in no acute distress. Eyes: Conjunctivae are normal. Head: Atraumatic. Nose: No congestion/rhinnorhea. Mouth/Throat: Mucous membranes are moist.  Neck: No stridor.  Cardiovascular: Normal rate, regular rhythm. Good peripheral circulation. Grossly normal heart sounds.   Respiratory: Normal respiratory effort.  No retractions. Lungs CTAB. Gastrointestinal: Soft and nontender. No distention. No CVA tenderness.  Musculoskeletal: No lower extremity tenderness nor edema. No gross deformities of extremities. Neurologic:  Normal speech and language. No gross focal neurologic deficits are appreciated.  Skin:  Skin is warm, dry and intact. No rash noted.  ____________________________________________   LABS (all labs ordered are listed, but only abnormal results are displayed)  Labs Reviewed  COMPREHENSIVE METABOLIC PANEL - Abnormal; Notable for the following components:      Result Value   Glucose, Bld 105 (*)    All other components within normal limits  URINALYSIS, ROUTINE W REFLEX MICROSCOPIC - Abnormal; Notable for the following components:   Color, Urine AMBER (*)    Specific Gravity, Urine 1.031 (*)    All other components within  normal limits  LIPASE, BLOOD  CBC  CK   ____________________________________________  RADIOLOGY  None ____________________________________________   PROCEDURES  Procedure(s) performed:   Procedures  None  ____________________________________________   INITIAL IMPRESSION / ASSESSMENT AND PLAN / ED COURSE  Pertinent labs & imaging results  that were available during my care of the patient were reviewed by me and considered in my medical decision making (see chart for details).  Patient presents to the emergency department with myalgias.  He had nausea and vomiting yesterday and has had several episodes.  No focal abdominal tenderness.  Afebrile here with normal vital signs.  Patient not on a statin.  I have added a CK.  He appears mildly dehydrated.  Plan for IV fluids and reassess. UA pending.   CK normal. UA unremarkable. No imaging at this time. Patient improved after IVF. DId not want Motrin in the ED. Plan for PCP follow up. Zofran Rx and Motrin provided PRN at home. Advised PO hydration at home.  ____________________________________________  FINAL CLINICAL IMPRESSION(S) / ED DIAGNOSES  Final diagnoses:  Myalgia  Non-intractable vomiting with nausea, unspecified vomiting type     MEDICATIONS GIVEN DURING THIS VISIT:  Medications  sodium chloride flush (NS) 0.9 % injection 3 mL (3 mLs Intravenous Given 07/07/18 1520)  ondansetron (ZOFRAN-ODT) disintegrating tablet 4 mg (4 mg Oral Given 07/07/18 1331)  sodium chloride 0.9 % bolus 1,000 mL (0 mLs Intravenous Stopped 07/07/18 1625)     NEW OUTPATIENT MEDICATIONS STARTED DURING THIS VISIT:  Discharge Medication List as of 07/07/2018  4:17 PM    START taking these medications   Details  dicyclomine (BENTYL) 20 MG tablet Take 1 tablet (20 mg total) by mouth 3 (three) times daily as needed for spasms (abdominal cramping)., Starting Mon 07/07/2018, Print    ondansetron (ZOFRAN ODT) 4 MG disintegrating tablet Take 1 tablet (4 mg total) by mouth every 8 (eight) hours as needed for nausea or vomiting., Starting Mon 07/07/2018, Print        Note:  This document was prepared using Dragon voice recognition software and may include unintentional dictation errors.  Alona Bene, MD Emergency Medicine    Barkley Kratochvil, Arlyss Repress, MD 07/08/18 [phone]

## 2018-07-07 NOTE — ED Triage Notes (Signed)
Pt presents for evaluation body aches and nausea. States started last Thursday. Denies URI symptoms or fever. Pt denies diarrhea.

## 2018-07-07 NOTE — Discharge Instructions (Signed)
You have been seen in the Emergency Department (ED) today for nausea and vomiting.  Your work up today has not shown a clear cause for your symptoms. You have been prescribed Zofran; please use as prescribed as needed for your nausea.  Take Tylenol and/or Motrin as needed for muscle cramping pain and stay hydrated with non-alcoholic fluids.   Follow up with your doctor as soon as possible regarding todays emergent visit and your symptoms of nausea.   Return to the ED if you develop abdominal, bloody vomiting, bloody diarrhea, if you are unable to tolerate fluids due to vomiting, or if you develop other symptoms that concern you.

## 2018-09-25 ENCOUNTER — Other Ambulatory Visit (HOSPITAL_COMMUNITY)
Admission: RE | Admit: 2018-09-25 | Discharge: 2018-09-25 | Disposition: A | Discharge status: Home / Self Care | Payer: 59 | Source: Ambulatory Visit | Attending: Family Medicine | Admitting: Family Medicine

## 2018-09-25 ENCOUNTER — Other Ambulatory Visit: Payer: Self-pay

## 2018-09-25 ENCOUNTER — Ambulatory Visit (INDEPENDENT_AMBULATORY_CARE_PROVIDER_SITE_OTHER): Payer: 59 | Admitting: Family Medicine

## 2018-09-25 ENCOUNTER — Encounter: Payer: Self-pay | Admitting: Family Medicine

## 2018-09-25 VITALS — BP 130/86 | HR 68 | Temp 97.5°F | Ht 65.0 in | Wt 182.6 lb

## 2018-09-25 DIAGNOSIS — N50811 Right testicular pain: Secondary | ICD-10-CM

## 2018-09-25 LAB — POCT URINALYSIS DIP (CLINITEK)
Bilirubin, UA: NEGATIVE
Blood, UA: NEGATIVE
Glucose, UA: NEGATIVE mg/dL
Ketones, POC UA: NEGATIVE mg/dL
Leukocytes, UA: NEGATIVE
Nitrite, UA: NEGATIVE
POC PROTEIN,UA: 30 — AB
Spec Grav, UA: 1.015 (ref 1.010–1.025)
Urobilinogen, UA: 1 E.U./dL
pH, UA: 8.5 — AB (ref 5.0–8.0)

## 2018-09-25 MED ORDER — DOXYCYCLINE HYCLATE 100 MG PO TABS: 100.0000 mg | ORAL_TABLET | Freq: Two times a day (BID) | ORAL | 0 refills | Status: DC

## 2018-09-25 NOTE — Progress Notes (Signed)
Acute Office Visit  Subjective:    Patient ID: Travis Estrada, male    DOB: [birth year], 33 y.o.   MRN: [id number]  Chief Complaint  Patient presents with  . right testicle pain    3-4 days     HPI Patient is in today for right testicular pain-started  3-4 days ago. No pain with urination. No urgency. No frequency. Pt married-one partner. 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 Lifting is aggravating the symptoms. Pt states no hernia. Pt has seen no change in the skin color.   Past Medical History:  Diagnosis Date  . Pterygium     No past surgical history on file.  Family History  Problem Relation Age of Onset  . Diabetes Mother   . Hypertension Sister     Social History   Socioeconomic History  . Marital status: Married    Spouse name: Not on file  . Number of children: Not on file  . Years of education: Not on file  . Highest education level: Not on file  Occupational History  . Not on file  Social Needs  . Financial resource strain: Not on file  . Food insecurity:    Worry: Not on file    Inability: Not on file  . Transportation needs:    Medical: Not on file    Non-medical: Not on file  Tobacco Use  . Smoking status: Never Smoker  . Smokeless tobacco: Never Used  Substance and Sexual Activity  . Alcohol use: Yes    Alcohol/week: 5.0 standard drinks    Types: 5 Cans of beer per week  . Drug use: No  . Sexual activity: Not on file  Lifestyle  . Physical activity:    Days per week: Not on file    Minutes per session: Not on file  . Stress: Not on file  Relationships  . Social connections:    Talks on phone: Not on file    Gets together: Not on file    Attends religious service: Not on file    Active member of club or organization: Not on file    Attends meetings of clubs or organizations: Not on file    Relationship status: Not on file  . Intimate partner violence:    Fear of current or ex partner: Not on file    Emotionally  abused: Not on file    Physically abused: Not on file    Forced sexual activity: Not on file  Other Topics Concern  . Not on file  Social History Narrative  . Not on file    Outpatient Medications Prior to Visit  Medication Sig Dispense Refill  . dicyclomine (BENTYL) 20 MG tablet Take 1 tablet (20 mg total) by mouth 3 (three) times daily as needed for spasms (abdominal cramping). (Patient not taking: Reported on 09/25/2018) 20 tablet 0  . metFORMIN (GLUCOPHAGE) 500 MG tablet Take 1 tablet (500 mg total) by mouth daily with breakfast. (Patient not taking: Reported on 09/25/2018) 90 tablet 3  . ondansetron (ZOFRAN ODT) 4 MG disintegrating tablet Take 1 tablet (4 mg total) by mouth every 8 (eight) hours as needed for nausea or vomiting. (Patient not taking: Reported on 09/25/2018) 20 tablet 0   No facility-administered medications prior to visit.     No Known Allergies  ROS   CONSTITUTIONAL: no fever GU: NO pain with urination, NO urinary frequency, NO  urgency, NO bladder problems, NO penile discharge, no abdominal pain.  Testicular pain -right side INTEG:  No skin changes Previously treated for PRE-Diabetes-no longer taking metformin  Objective:    Physical Exam  Constitutional: He appears well-developed and well-nourished.  Cardiovascular: Normal rate and regular rhythm.  Pulmonary/Chest: Effort normal and breath sounds normal.  Abdominal: Soft. Bowel sounds are normal.  Genitourinary:    Penis normal.   Skin: No erythema.  testicle -right tenderness to palpation, no masses palpable, no inguinal masses,no hernia  BP 130/86 (BP Location: Right Arm, Patient Position: Sitting, Cuff Size: Normal)   Pulse 68   Temp (!) 97.5 F (36.4 C) (Oral)   Ht 5\' 5"  (1.651 m)   Wt 182 lb 9.6 oz (82.8 kg)   SpO2 96%   BMI 30.39 kg/m  Wt Readings from Last 3 Encounters:  09/25/18 182 lb 9.6 oz (82.8 kg)  07/07/18 145 lb (65.8 kg)  12/04/17 191 lb 9.6 oz (86.9 kg)    Health Maintenance Due   Topic Date Due  . HIV Screening  09/23/1999  . TETANUS/TDAP  09/23/2003    Lab Results  Component Value Date   TSH 2.070 02/11/2017   Lab Results  Component Value Date   WBC 6.6 07/07/2018   HGB 14.3 07/07/2018   HCT 43.1 07/07/2018   MCV 87.8 07/07/2018   PLT 289 07/07/2018   Lab Results  Component Value Date   NA 139 07/07/2018   K 3.6 07/07/2018   CO2 26 07/07/2018   GLUCOSE 105 (H) 07/07/2018   BUN 15 07/07/2018   CREATININE 1.02 07/07/2018   BILITOT 1.2 07/07/2018   ALKPHOS 69 07/07/2018   AST 28 07/07/2018   ALT 30 07/07/2018   PROT 7.5 07/07/2018   ALBUMIN 4.2 07/07/2018   CALCIUM 9.2 07/07/2018   ANIONGAP 11 07/07/2018   Lab Results  Component Value Date   HGBA1C 6.2 (A) 12/04/2017       Assessment & Plan:   Testicular pain, right - Plan: POCT URINALYSIS DIP (CLINITEK), GC/Chlamydia probe amp [facility]  Doxy-rx-d/w pt notify if symptoms worsen or do not improve. No palpable hernia, off work x 2 days-rest, no tight undergarments. U/a normal, GC pending  LISA Mat CarneLEIGH CORUM, MD

## 2018-09-25 NOTE — Patient Instructions (Addendum)
  If no improvement or worsening call for referral to urology   If you have lab work done today you will be contacted with your lab results within the next 2 weeks.  If you have not heard from [place] then please contact us. The fastest way to get your results is to register for My Chart.   IF you received an x-ray today, you will receive an invoice from [facility]. Please contact [facility] at [phone] with questions or concerns regarding your invoice.   IF you received labwork today, you will receive an invoice from [facility]. Please contact [facility] at [phone] with questions or concerns regarding your invoice.   Our billing staff will not be able to assist you with questions regarding bills from these companies.  You will be contacted with the lab results as soon as they are available. The fastest way to get your results is to activate your My Chart account. Instructions are located on the last page of this paperwork. If you have not heard from [place] regarding the results in 2 weeks, please contact this office.

## 2018-09-26 LAB — GC/CHLAMYDIA PROBE AMP (~~LOC~~) NOT AT ARMC
Chlamydia: NEGATIVE
Neisseria Gonorrhea: NEGATIVE

## 2018-09-30 ENCOUNTER — Telehealth: Payer: 59 | Admitting: Registered Nurse

## 2018-10-03 NOTE — Progress Notes (Signed)
No vm set up. 

## 2018-11-06 IMAGING — DX DG KNEE COMPLETE 4+V*R*
4 series · 4 of 4 positions shown · non-contrast
Comparison: Right tib-fib 03/26/2016

CLINICAL DATA: Acute right knee pain

EXAM:
RIGHT KNEE - COMPLETE 4+ VIEW

[knee ap]
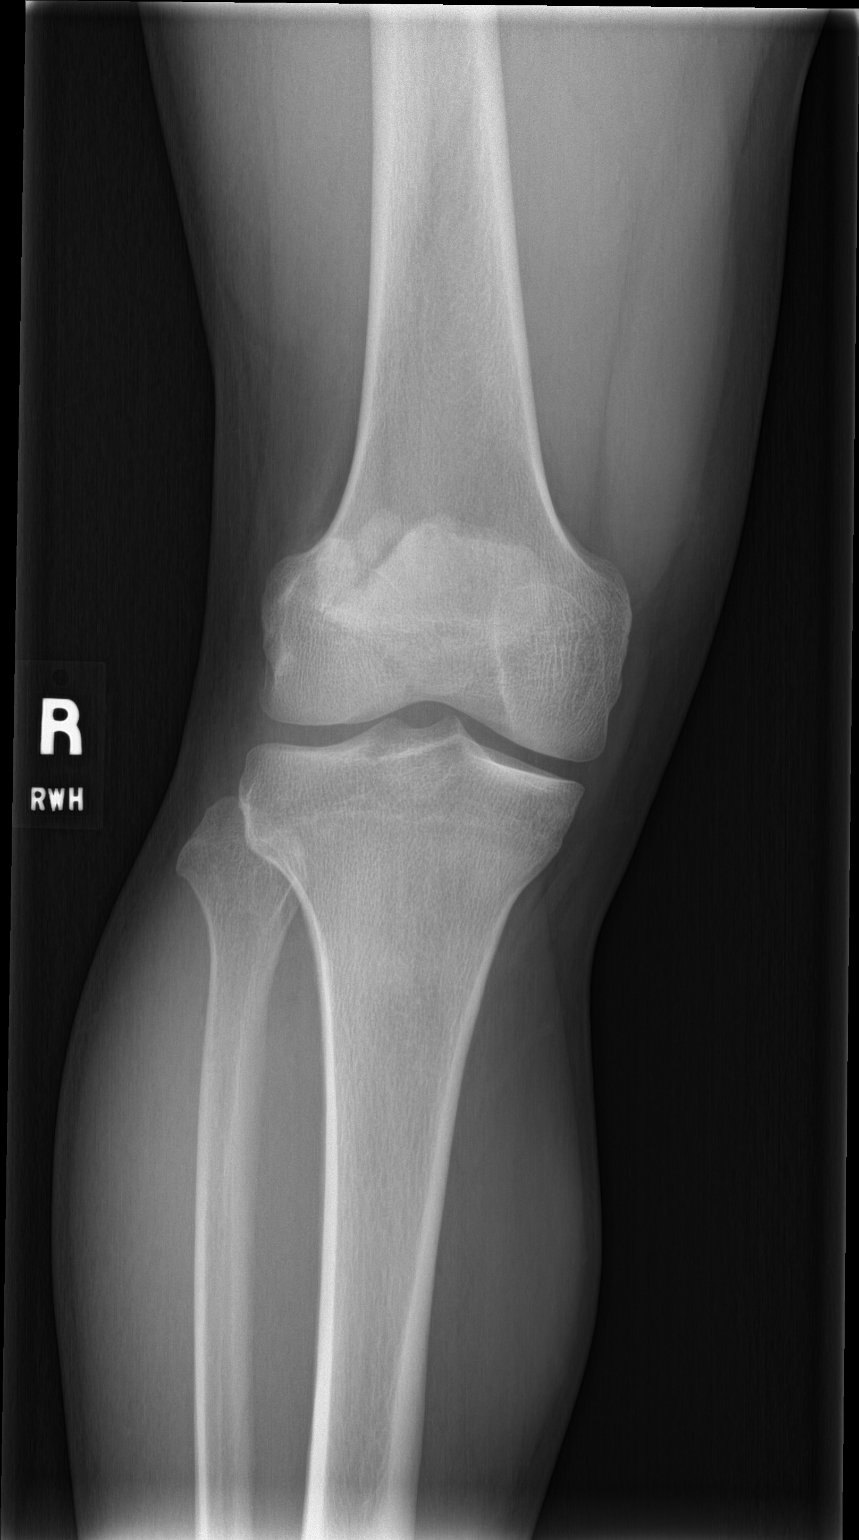

[knee obl (1 of 2)]
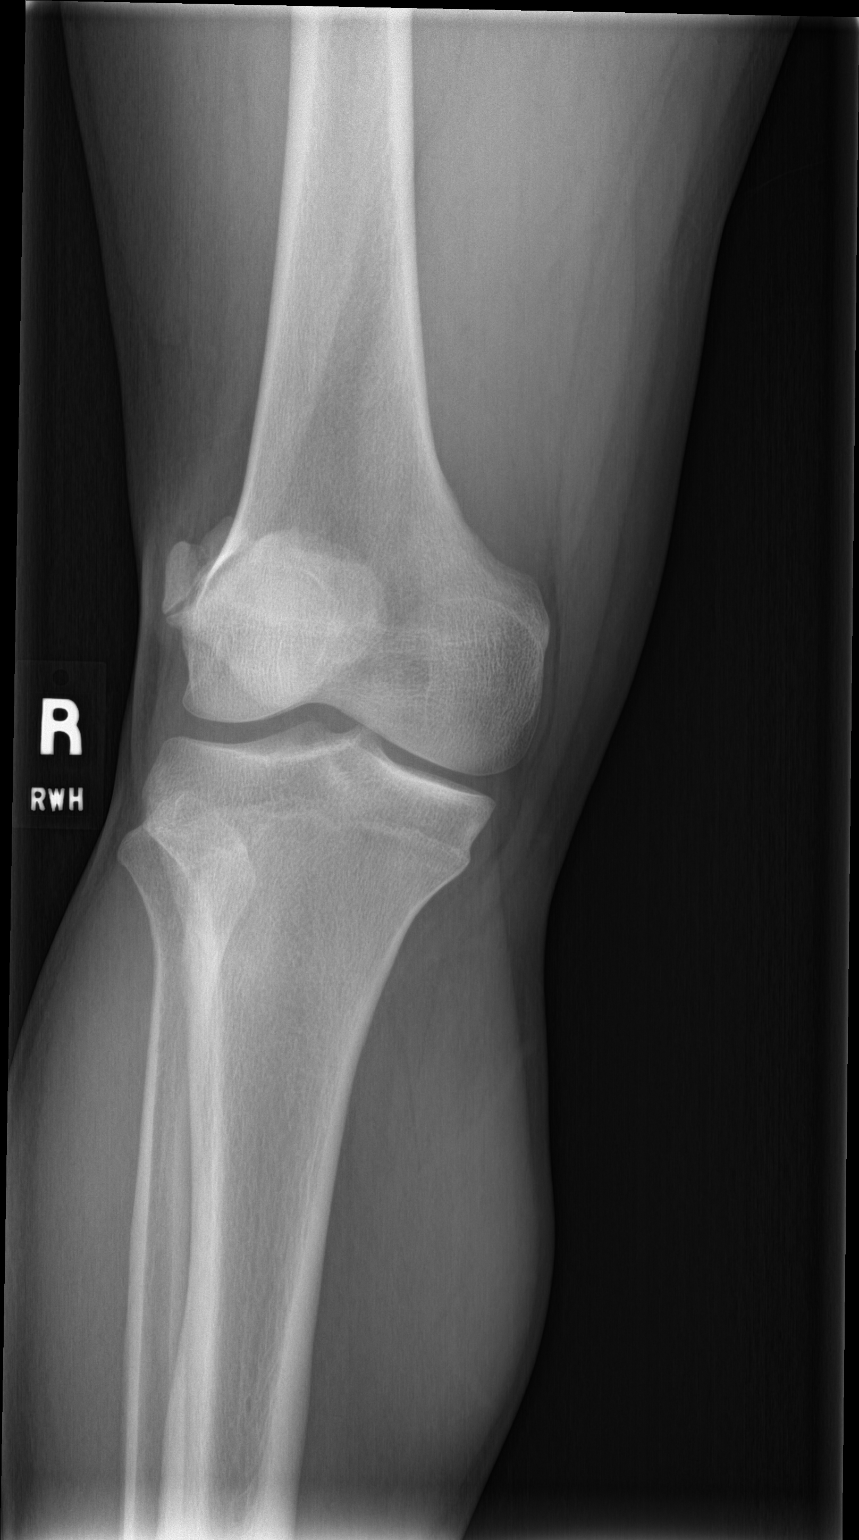

[knee obl (2 of 2)]
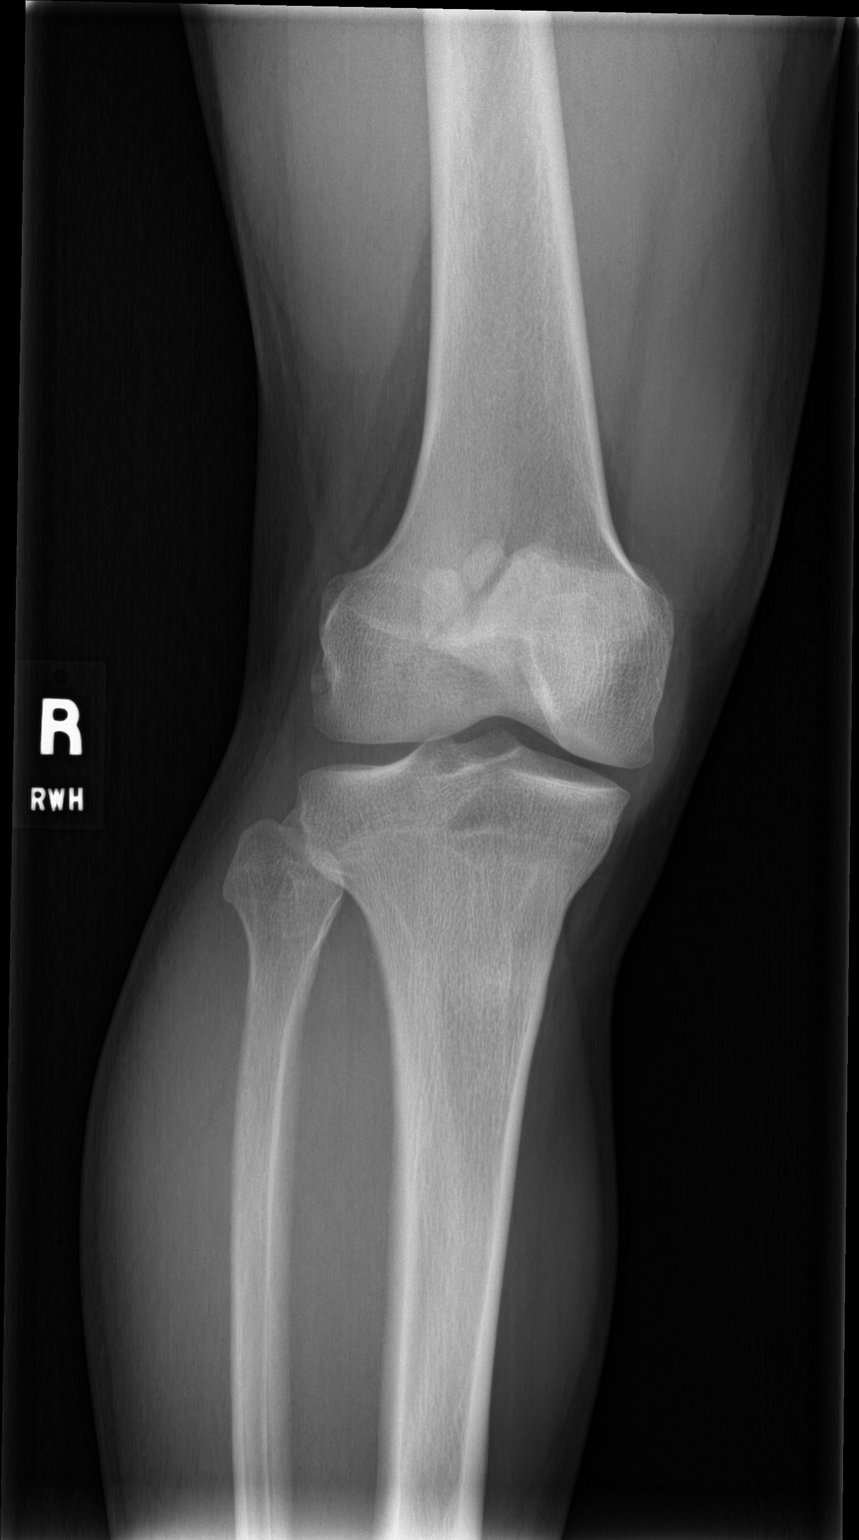

[knee lat]
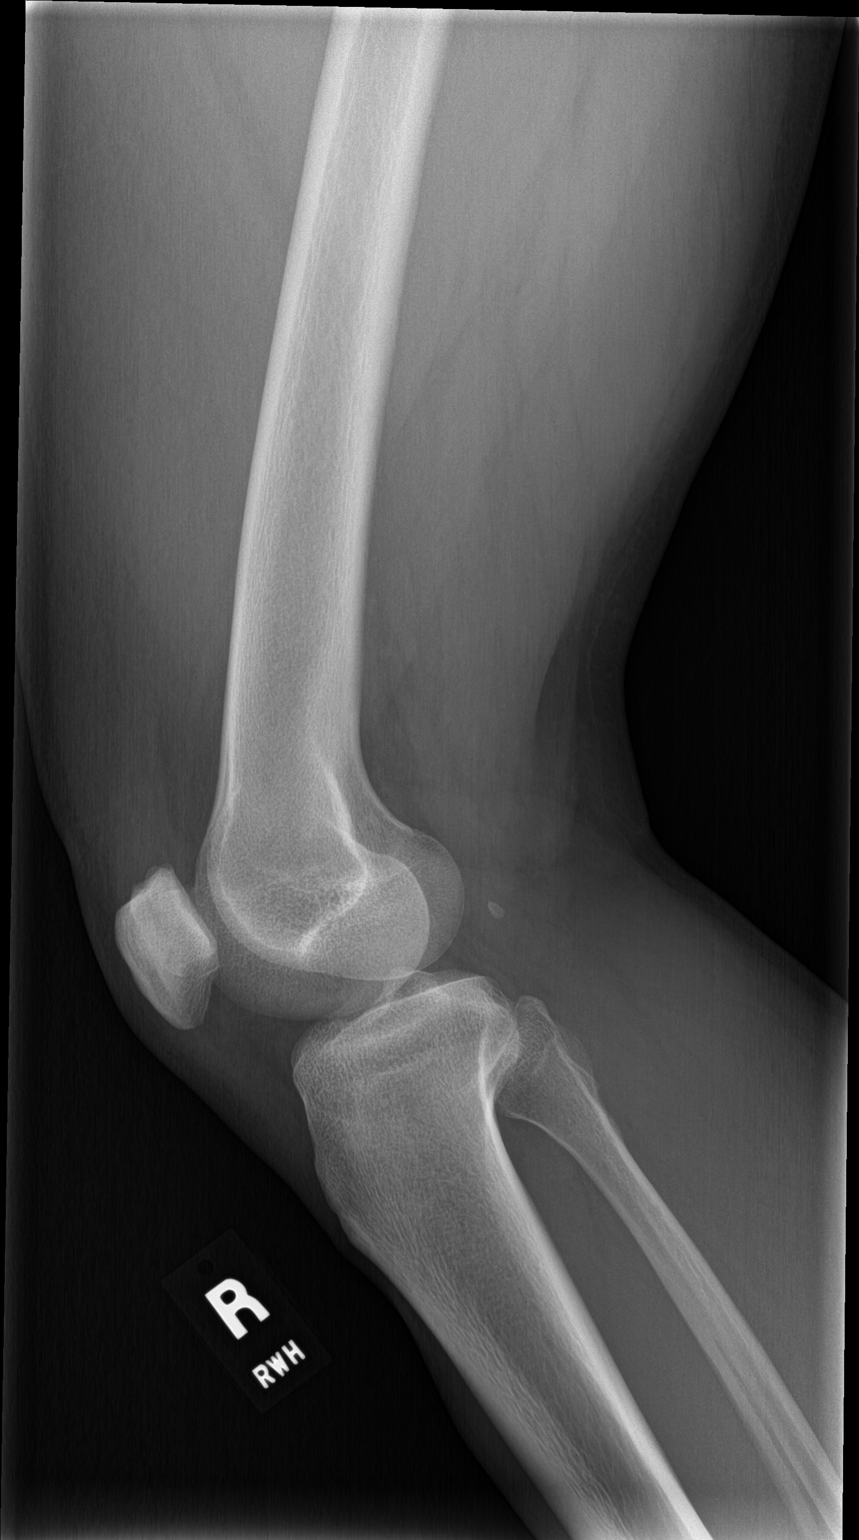

[4 of 4 positions shown; findings below may reference images not displayed]

FINDINGS: Median lateral joint spaces are normal.  No joint space narrowing

Irregularity of the superior and lateral portion of the patella
which appears to be tripartite patella. No joint effusion or
fracture
IMPRESSION: Tripartite patella.  Negative for fracture or effusion.

## 2018-11-25 ENCOUNTER — Emergency Department (HOSPITAL_COMMUNITY)
Admission: EM | Admit: 2018-11-25 | Discharge: 2018-11-25 | Disposition: A | Discharge status: Home / Self Care | Payer: 59 | Attending: Emergency Medicine | Admitting: Emergency Medicine

## 2018-11-25 ENCOUNTER — Emergency Department (HOSPITAL_COMMUNITY): Payer: 59

## 2018-11-25 ENCOUNTER — Encounter (HOSPITAL_COMMUNITY): Payer: Self-pay | Admitting: Student

## 2018-11-25 ENCOUNTER — Other Ambulatory Visit: Payer: Self-pay

## 2018-11-25 DIAGNOSIS — R05 Cough: Secondary | ICD-10-CM | POA: Insufficient documentation

## 2018-11-25 DIAGNOSIS — R509 Fever, unspecified: Secondary | ICD-10-CM | POA: Diagnosis present

## 2018-11-25 DIAGNOSIS — U071 COVID-19: Secondary | ICD-10-CM | POA: Diagnosis not present

## 2018-11-25 DIAGNOSIS — R51 Headache: Secondary | ICD-10-CM | POA: Diagnosis not present

## 2018-11-25 DIAGNOSIS — M7918 Myalgia, other site: Secondary | ICD-10-CM | POA: Diagnosis not present

## 2018-11-25 LAB — CBC WITH DIFFERENTIAL/PLATELET
Abs Immature Granulocytes: 0.04 10*3/uL (ref 0.00–0.07)
Basophils Absolute: 0 10*3/uL (ref 0.0–0.1)
Basophils Relative: 0 %
Eosinophils Absolute: 0.1 10*3/uL (ref 0.0–0.5)
Eosinophils Relative: 0 %
HCT: 40.6 % (ref 39.0–52.0)
Hemoglobin: 14.2 g/dL (ref 13.0–17.0)
Immature Granulocytes: 0 %
Lymphocytes Relative: 19 %
Lymphs Abs: 2.2 10*3/uL (ref 0.7–4.0)
MCH: 29.8 pg (ref 26.0–34.0)
MCHC: 35 g/dL (ref 30.0–36.0)
MCV: 85.3 fL (ref 80.0–100.0)
Monocytes Absolute: 0.9 10*3/uL (ref 0.1–1.0)
Monocytes Relative: 8 %
Neutro Abs: 8.3 10*3/uL — ABNORMAL HIGH (ref 1.7–7.7)
Neutrophils Relative %: 73 %
Platelets: 231 10*3/uL (ref 150–400)
RBC: 4.76 MIL/uL (ref 4.22–5.81)
RDW: 12.6 % (ref 11.5–15.5)
WBC: 11.4 10*3/uL — ABNORMAL HIGH (ref 4.0–10.5)
nRBC: 0 % (ref 0.0–0.2)

## 2018-11-25 LAB — COMPREHENSIVE METABOLIC PANEL
ALT: 22 U/L (ref 0–44)
AST: 25 U/L (ref 15–41)
Albumin: 4 g/dL (ref 3.5–5.0)
Alkaline Phosphatase: 67 U/L (ref 38–126)
Anion gap: 12 (ref 5–15)
BUN: 10 mg/dL (ref 6–20)
CO2: 21 mmol/L — ABNORMAL LOW (ref 22–32)
Calcium: 9.1 mg/dL (ref 8.9–10.3)
Chloride: 106 mmol/L (ref 98–111)
Creatinine, Ser: 0.95 mg/dL (ref 0.61–1.24)
GFR calc Af Amer: >60 mL/min (ref >60)
GFR calc non Af Amer: >60 mL/min (ref >60)
Glucose, Bld: 102 mg/dL — ABNORMAL HIGH (ref 70–99)
Potassium: 3.4 mmol/L — ABNORMAL LOW (ref 3.5–5.1)
Sodium: 139 mmol/L (ref 135–145)
Total Bilirubin: 0.7 mg/dL (ref 0.3–1.2)
Total Protein: 7.4 g/dL (ref 6.5–8.1)

## 2018-11-25 LAB — SARS CORONAVIRUS 2 BY RT PCR (HOSPITAL ORDER, PERFORMED IN ~~LOC~~ HOSPITAL LAB): SARS Coronavirus 2: POSITIVE — AB

## 2018-11-25 MED ORDER — ACETAMINOPHEN ER 650 MG PO TBCR: 650.0000 mg | EXTENDED_RELEASE_TABLET | Freq: Three times a day (TID) | ORAL | 0 refills | Status: DC | PRN

## 2018-11-25 MED ORDER — POTASSIUM CHLORIDE CRYS ER 20 MEQ PO TBCR
30.0000 meq | EXTENDED_RELEASE_TABLET | Freq: Once | ORAL | Status: AC
  Administered 2018-11-25: 14:00:00 30 meq via ORAL
  Filled 2018-11-25: qty 2

## 2018-11-25 MED ORDER — BENZONATATE 100 MG PO CAPS
200.0000 mg | ORAL_CAPSULE | Freq: Once | ORAL | Status: AC
  Administered 2018-11-25: 200 mg via ORAL
  Filled 2018-11-25: qty 2

## 2018-11-25 MED ORDER — DEXAMETHASONE SODIUM PHOSPHATE 10 MG/ML IJ SOLN
10.0000 mg | Freq: Once | INTRAMUSCULAR | Status: AC
  Administered 2018-11-25: 10 mg via INTRAVENOUS
  Filled 2018-11-25: qty 1

## 2018-11-25 MED ORDER — BENZONATATE 100 MG PO CAPS: 100.0000 mg | ORAL_CAPSULE | Freq: Three times a day (TID) | ORAL | 0 refills | Status: DC | PRN

## 2018-11-25 MED ORDER — ACETAMINOPHEN 325 MG PO TABS
650.0000 mg | ORAL_TABLET | Freq: Once | ORAL | Status: AC
  Administered 2018-11-25: 650 mg via ORAL
  Filled 2018-11-25: qty 2

## 2018-11-25 MED ORDER — ALBUTEROL SULFATE HFA 108 (90 BASE) MCG/ACT IN AERS
4.0000 | INHALATION_SPRAY | Freq: Once | RESPIRATORY_TRACT | Status: AC
  Administered 2018-11-25: 4 via RESPIRATORY_TRACT
  Filled 2018-11-25: qty 6.7

## 2018-11-25 MED ORDER — IBUPROFEN 800 MG PO TABS
800.0000 mg | ORAL_TABLET | Freq: Once | ORAL | Status: AC
  Administered 2018-11-25: 800 mg via ORAL
  Filled 2018-11-25: qty 1

## 2018-11-25 MED ORDER — ONDANSETRON HCL 4 MG/2ML IJ SOLN
4.0000 mg | Freq: Once | INTRAMUSCULAR | Status: AC
  Administered 2018-11-25: 4 mg via INTRAVENOUS
  Filled 2018-11-25: qty 2

## 2018-11-25 MED ORDER — SODIUM CHLORIDE 0.9 % IV BOLUS
1000.0000 mL | Freq: Once | INTRAVENOUS | Status: AC
  Administered 2018-11-25: 1000 mL via INTRAVENOUS

## 2018-11-25 NOTE — ED Notes (Signed)
Pt ambulated in room.  SpO2 maintained above 97 at all times.  His respiration rate increased from 30 while resting to 40 while ambulating.

## 2018-11-25 NOTE — ED Provider Notes (Signed)
[facility] EMERGENCY DEPARTMENT Provider Note   CSN: [id number] Arrival date & time: 11/25/18  1113     History   Chief Complaint Chief Complaint  Patient presents with  . Fever  . Covid symptoms    HPI Travis Estrada is a 33 y.o. male with a hx of adjustment disorder & prediabetes who presents to the ED with complaints of fever x 6 days. Patient reports constellation of sxs including subjective fever, chills, body aches, sore throat, headaches, productive cough of mucous sputum, dyspnea, chest pain w/ cough otherwise no chest pain, nausea, emesis (approximately 2-3 episodes per day), diarrhea, & poor appetite. Sxs are constant. Trying robitussin at home without relief. Family members sick w/ similar however they have started to get better while he feels worse. No known covid 19 exposures that he is aware of. Denies change in vision, neck stiffness, numbness, weakness, dizziness, syncope, wheezing, abdominal pain, melena, hematochezia, or hematemesis. Denies leg pain/swelling, hemoptysis, recent surgery/trauma, recent long travel, hormone use, personal hx of cancer, or hx of DVT/PE.    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      HPI  Past Medical History:  Diagnosis Date  . Pterygium     Patient Active Problem List   Diagnosis Date Noted  . Testicular pain, right 09/25/2018  . Prediabetes 12/04/2017  . Adjustment disorder with mixed anxiety and depressed mood 02/11/2017  . Chronic pain of right knee 11/07/2016    No past surgical history on file.      Home Medications    Prior to Admission medications   Medication Sig Start Date End Date Taking? Authorizing Provider  dicyclomine (BENTYL) 20 MG tablet Take 1 tablet (20 mg total) by mouth 3 (three) times daily as needed for spasms (abdominal cramping). Patient not taking: Reported on 09/25/2018 07/07/18   Long, Wonda Olds, MD  doxycycline (VIBRA-TABS) 100 MG tablet Take 1 tablet (100 mg total) by mouth 2  (two) times daily. 09/25/18   Maryruth Hancock, MD  metFORMIN (GLUCOPHAGE) 500 MG tablet Take 1 tablet (500 mg total) by mouth daily with breakfast. Patient not taking: Reported on 09/25/2018 12/04/17   McVey, Gelene Mink, PA-C  ondansetron (ZOFRAN ODT) 4 MG disintegrating tablet Take 1 tablet (4 mg total) by mouth every 8 (eight) hours as needed for nausea or vomiting. Patient not taking: Reported on 09/25/2018 07/07/18   Long, Wonda Olds, MD    Family History Family History  Problem Relation Age of Onset  . Diabetes Mother   . Hypertension Sister     Social History Social History   Tobacco Use  . Smoking status: Never Smoker  . Smokeless tobacco: Never Used  Substance Use Topics  . Alcohol use: Yes    Alcohol/week: 5.0 standard drinks    Types: 5 Cans of beer per week  . Drug use: No     Allergies   Patient has no known allergies.   Review of Systems Review of Systems  Constitutional: Positive for appetite change, chills and fever.  HENT: Positive for sore throat. Negative for ear pain, trouble swallowing and voice change.   Respiratory: Positive for cough and shortness of breath.   Cardiovascular: Positive for chest pain (w/ coughing otherwise none). Negative for palpitations and leg swelling.  Gastrointestinal: Positive for diarrhea, nausea and vomiting. Negative for abdominal pain, blood in stool and constipation.  Genitourinary: Negative for dysuria.  Musculoskeletal: Positive for myalgias.  Neurological: Positive for headaches. Negative for dizziness, tremors, seizures, syncope, speech  difficulty, weakness, light-headedness and numbness.  All other systems reviewed and are negative.    Physical Exam Updated Vital Signs BP 119/74 (BP Location: Right Arm)   Pulse 87   Temp 99.3 F (37.4 C) (Oral)   Resp 20   SpO2 100%   Physical Exam Vitals signs and nursing note reviewed.  Constitutional:      General: He is not in acute distress.    Appearance: He is  well-developed. He is not toxic-appearing.  HENT:     Head: Normocephalic and atraumatic.     Nose:     Right Sinus: No maxillary sinus tenderness or frontal sinus tenderness.     Left Sinus: No maxillary sinus tenderness or frontal sinus tenderness.     Mouth/Throat:     Mouth: Mucous membranes are dry.     Pharynx: Oropharynx is clear. Uvula midline. No oropharyngeal exudate.     Comments: Posterior oropharynx is symmetric appearing. Patient tolerating own secretions without difficulty. No trismus. No drooling. No hot potato voice. No swelling beneath the tongue, submandibular compartment is soft.  Eyes:     General:        Right eye: No discharge.        Left eye: No discharge.     Conjunctiva/sclera: Conjunctivae normal.  Neck:     Musculoskeletal: Neck supple. No neck rigidity.  Cardiovascular:     Rate and Rhythm: Normal rate and regular rhythm.  Pulmonary:     Effort: Tachypnea present. No respiratory distress.     Breath sounds: Normal breath sounds. No wheezing, rhonchi or rales.  Chest:     Chest wall: Tenderness (mild anterior without overlying skin changes or palpable crepitus) present.  Abdominal:     General: There is no distension.     Palpations: Abdomen is soft.     Tenderness: There is no abdominal tenderness. There is no guarding or rebound.  Musculoskeletal:        General: No tenderness.     Right lower leg: No edema.     Left lower leg: No edema.  Skin:    General: Skin is warm and dry.     Findings: No rash.  Neurological:     Mental Status: He is alert.     Comments: Clear speech.   Psychiatric:        Behavior: Behavior normal.    ED Treatments / Results  Labs (all labs ordered are listed, but only abnormal results are displayed) Labs Reviewed  SARS CORONAVIRUS 2 (HOSPITAL ORDER, PERFORMED IN [facility]) - Abnormal; Notable for the following components:      Result Value   SARS Coronavirus 2 POSITIVE (*)    All other components  within normal limits  CBC WITH DIFFERENTIAL/PLATELET - Abnormal; Notable for the following components:   WBC 11.4 (*)    Neutro Abs 8.3 (*)    All other components within normal limits  COMPREHENSIVE METABOLIC PANEL - Abnormal; Notable for the following components:   Potassium 3.4 (*)    CO2 21 (*)    Glucose, Bld 102 (*)    All other components within normal limits    EKG EKG Interpretation  Date/Time:  Tuesday November 25 2018 12:09:22 EDT Ventricular Rate:  77 PR Interval:    QRS Duration: 104 QT Interval:  381 QTC Calculation: 432 R Axis:   17 Text Interpretation:  Sinus rhythm Borderline ST elevation, lateral leads No previous ECGs available Confirmed by Alvira MondaySchlossman, Erin ([id number])  on 11/25/2018 1:51:21 PM   Radiology Dg Chest Portable 1 View  Result Date: 11/25/2018 CLINICAL DATA:  Fevers, cough, chills. EXAM: PORTABLE CHEST 1 VIEW COMPARISON:  None. FINDINGS: The heart size and mediastinal contours are within normal limits. Both lungs are clear. The visualized skeletal structures are unremarkable. IMPRESSION: No active disease. Electronically Signed   By: Gerome Samavid  Williams III M.D   On: 11/25/2018 12:27    Procedures Procedures (including critical care time)  Medications Ordered in ED Medications  sodium chloride 0.9 % bolus 1,000 mL (0 mLs Intravenous Stopped 11/25/18 1357)  acetaminophen (TYLENOL) tablet 650 mg (650 mg Oral Given 11/25/18 1235)  benzonatate (TESSALON) capsule 200 mg (200 mg Oral Given 11/25/18 1235)  ondansetron (ZOFRAN) injection 4 mg (4 mg Intravenous Given 11/25/18 1354)  potassium chloride SA (K-DUR) CR tablet 30 mEq (30 mEq Oral Given 11/25/18 1354)  ibuprofen (ADVIL) tablet 800 mg (800 mg Oral Given 11/25/18 1354)  albuterol (VENTOLIN HFA) 108 (90 Base) MCG/ACT inhaler 4 puff (4 puffs Inhalation Given 11/25/18 1556)  dexamethasone (DECADRON) injection 10 mg (10 mg Intravenous Given 11/25/18 1556)     Initial Impression / Assessment and Plan / ED Course  I have  reviewed the triage vital signs and the nursing notes.  Pertinent labs & imaging results that were available during my care of the patient were reviewed by me and considered in my medical decision making (see chart for details).   Patient presents to the ED with sxs suspicion for viral illness-specifically concern for COVID 19. Nontoxic appearing, vitals w/ borderline oral temp & tachypnea. Lungs are clear. Abdomen soft & nontender w/o peritoneal signs. Plan for CXR, basic labs, & anti-pyretics.   CBC: Mild leukocytosis @ 11.4 w/ left shift, no significant anemia.  CMP: mild hypokalemia @ 3.4- oral replacement. No significant electrolyte derangement. Renal function & LFTs preserved.  CXR; No active disease EKG: No STEMI.    Covid 19: positive.   Suspect sxs secondary to covid, low risk wells - doubt PE, chest pain reproducible w/ palpation- doubt ACS, abdomen nontender w/o peritoneal signs doubt acute surgical abdomen. No meningismus.   At rest patient tachypneic w/ RR 28-32 breaths per minute, trial of ambulation maintained SpO2 @ 97% however patient's tachypnea increased to 40 breaths per minute. He is concerned for his safety.   Will consult for admission.   Findings and plan of care discussed with supervising physician Dr. Dalene SeltzerSchlossman who is in agreement.   15:30: CONSULT: Discussed w/ hospitalist Dr. Katrinka BlazingSmith- recommends giving patient albuterol for tachypnea to see if this improves the patient's respiratory status, he will discuss w/ [facility] & assess the patient in the ED to determine if he feels there is need for admission.   Care transitioned to Langston MaskerKaren Sofia PA-C at change of shift pending discussion w/ hospitalist service & disposition of the patient.   Rhina BrackettRene Esquivias was evaluated in Emergency Department on 11/25/2018 for the symptoms described in the history of present illness. He/she was evaluated in the context of the global COVID-19 pandemic, which necessitated consideration  that the patient might be at risk for infection with the SARS-CoV-2 virus that causes COVID-19. Institutional protocols and algorithms that pertain to the evaluation of patients at risk for COVID-19 are in a state of rapid change based on information released by regulatory bodies including the CDC and federal and state organizations. These policies and algorithms were followed during the patient's care in the ED.  Final Clinical Impressions(s) / ED  Diagnoses   Final diagnoses:  COVID-19    ED Discharge Orders    None       Cherly Andersonetrucelli, Madeline Bebout R, PA-C 11/25/18 1606    Alvira MondaySchlossman, Erin, MD 11/28/18 1950

## 2018-11-25 NOTE — ED Provider Notes (Signed)
Pt seen by Hospitalist who feels pt does not need admission.  Pt feels better after albuterol.  Pt comfortable going home.  Pt counseled on need to return if any problems.    Sidney Ace 11/25/18 1658    Davonna Belling, MD 11/25/18 2300

## 2018-11-25 NOTE — Discharge Instructions (Addendum)
You were seen in the emergency department and diagnosed with coronavirus. Please be sure to stay well-hydrated. Use albuterol inhaler 1 to 2 puffs as needed every 4-6 hours for shortness of breath. Take Tessalon every 8 hours as needed for cough. Take Tylenol every 8 hours as needed for fever/pain.  We have prescribed you new medication(s) today. Discuss the medications prescribed today with your pharmacist as they can have adverse effects and interactions with your other medicines including over the counter and prescribed medications. Seek medical evaluation if you start to experience new or abnormal symptoms after taking one of these medicines, seek care immediately if you start to experience difficulty breathing, feeling of your throat closing, facial swelling, or rash as these could be indications of a more serious allergic reaction  Patients with covid need to quarantine themselves for 14 days. You may be able to discontinue self quarantine if the following conditions are met:   Persons with COVID-19 who have symptoms and were directed to care for themselves at home may discontinue home isolation under the  following conditions: - It has been at least 7 days have passed since symptoms first appeared. - AND at least 3 days (72 hours) have passed since recovery defined as resolution of fever without the use of fever-reducing medications and improvement in respiratory symptoms (e.g., cough, shortness of breath)  Please follow the below quarantine instructions.   Please follow up with primary care within 3-5 days for re-evaluation- call prior to going to the office to make them aware of your symptoms. Return to the ER for new or worsening symptoms including but not limited to increased work of breathing, fever, chest pain, passing out, or any other concerns.       Person Under Monitoring Name: Aaric Dolph  Location: [place] [address]   Infection Prevention  Recommendations for Individuals Confirmed to have, or Being Evaluated for, 2019 Novel Coronavirus (COVID-19) Infection Who Receive Care at Home  Individuals who are confirmed to have, or are being evaluated for, COVID-19 should follow the prevention steps below until a healthcare provider or local or state health department says they can return to normal activities.  Stay home except to get medical care You should restrict activities outside your home, except for getting medical care. Do not go to work, school, or public areas, and do not use public transportation or taxis.  Call ahead before visiting your doctor Before your medical appointment, call the healthcare provider and tell them that you have, or are being evaluated for, COVID-19 infection. This will help the healthcare providers office take steps to keep other people from getting infected. Ask your healthcare provider to call the local or state health department.  Monitor your symptoms Seek prompt medical attention if your illness is worsening (e.g., difficulty breathing). Before going to your medical appointment, call the healthcare provider and tell them that you have, or are being evaluated for, COVID-19 infection. Ask your healthcare provider to call the local or state health department.  Wear a facemask You should wear a facemask that covers your nose and mouth when you are in the same room with other people and when you visit a healthcare provider. People who live with or visit you should also wear a facemask while they are in the same room with you.  Separate yourself from other people in your home As much as possible, you should stay in a different room from other people in your home. Also, you should  use a separate bathroom, if available.  Avoid sharing household items You should not share dishes, drinking glasses, cups, eating utensils, towels, bedding, or other items with other people in your home. After using  these items, you should wash them thoroughly with soap and water.  Cover your coughs and sneezes Cover your mouth and nose with a tissue when you cough or sneeze, or you can cough or sneeze into your sleeve. Throw used tissues in a lined trash can, and immediately wash your hands with soap and water for at least 20 seconds or use an alcohol-based hand rub.  Wash your [facility] your hands often and thoroughly with soap and water for at least 20 seconds. You can use an alcohol-based hand sanitizer if soap and water are not available and if your hands are not visibly dirty. Avoid touching your eyes, nose, and mouth with unwashed hands.   Prevention Steps for Caregivers and Household Members of Individuals Confirmed to have, or Being Evaluated for, COVID-19 Infection Being Cared for in the Home  If you live with, or provide care at home for, a person confirmed to have, or being evaluated for, COVID-19 infection please follow these guidelines to prevent infection:  Follow healthcare providers instructions Make sure that you understand and can help the patient follow any healthcare provider instructions for all care.  Provide for the patients basic needs You should help the patient with basic needs in the home and provide support for getting groceries, prescriptions, and other personal needs.  Monitor the patients symptoms If they are getting sicker, call his or her medical provider and tell them that the patient has, or is being evaluated for, COVID-19 infection. This will help the healthcare providers office take steps to keep other people from getting infected. Ask the healthcare provider to call the local or state health department.  Limit the number of people who have contact with the patient If possible, have only one caregiver for the patient. Other household members should stay in another home or place of residence. If this is not possible, they should stay in another room,  or be separated from the patient as much as possible. Use a separate bathroom, if available. Restrict visitors who do not have an essential need to be in the home.  Keep older adults, very young children, and other sick people away from the patient Keep older adults, very young children, and those who have compromised immune systems or chronic health conditions away from the patient. This includes people with chronic heart, lung, or kidney conditions, diabetes, and cancer.  Ensure good ventilation Make sure that shared spaces in the home have good air flow, such as from an air conditioner or an opened window, weather permitting.  Wash your hands often Wash your hands often and thoroughly with soap and water for at least 20 seconds. You can use an alcohol based hand sanitizer if soap and water are not available and if your hands are not visibly dirty. Avoid touching your eyes, nose, and mouth with unwashed hands. Use disposable paper towels to dry your hands. If not available, use dedicated cloth towels and replace them when they become wet.  Wear a facemask and gloves Wear a disposable facemask at all times in the room and gloves when you touch or have contact with the patients blood, body fluids, and/or secretions or excretions, such as sweat, saliva, sputum, nasal mucus, vomit, urine, or feces.  Ensure the mask fits over your nose and mouth  tightly, and do not touch it during use. Throw out disposable facemasks and gloves after using them. Do not reuse. Wash your hands immediately after removing your facemask and gloves. If your personal clothing becomes contaminated, carefully remove clothing and launder. Wash your hands after handling contaminated clothing. Place all used disposable facemasks, gloves, and other waste in a lined container before disposing them with other household waste. Remove gloves and wash your hands immediately after handling these items.  Do not share dishes,  glasses, or other household items with the patient Avoid sharing household items. You should not share dishes, drinking glasses, cups, eating utensils, towels, bedding, or other items with a patient who is confirmed to have, or being evaluated for, COVID-19 infection. After the person uses these items, you should wash them thoroughly with soap and water.  Wash laundry thoroughly Immediately remove and wash clothes or bedding that have blood, body fluids, and/or secretions or excretions, such as sweat, saliva, sputum, nasal mucus, vomit, urine, or feces, on them. Wear gloves when handling laundry from the patient. Read and follow directions on labels of laundry or clothing items and detergent. In general, wash and dry with the warmest temperatures recommended on the label.  Clean all areas the individual has used often Clean all touchable surfaces, such as counters, tabletops, doorknobs, bathroom fixtures, toilets, phones, keyboards, tablets, and bedside tables, every day. Also, clean any surfaces that may have blood, body fluids, and/or secretions or excretions on them. Wear gloves when cleaning surfaces the patient has come in contact with. Use a diluted bleach solution (e.g., dilute bleach with 1 part bleach and 10 parts water) or a household disinfectant with a label that says EPA-registered for coronaviruses. To make a bleach solution at home, add 1 tablespoon of bleach to 1 quart (4 cups) of water. For a larger supply, add  cup of bleach to 1 gallon (16 cups) of water. Read labels of cleaning products and follow recommendations provided on product labels. Labels contain instructions for safe and effective use of the cleaning product including precautions you should take when applying the product, such as wearing gloves or eye protection and making sure you have good ventilation during use of the product. Remove gloves and wash hands immediately after cleaning.  Monitor yourself for signs and  symptoms of illness Caregivers and household members are considered close contacts, should monitor their health, and will be asked to limit movement outside of the home to the extent possible. Follow the monitoring steps for close contacts listed on the symptom monitoring form.   ? If you have additional questions, contact your local health department or call the epidemiologist on call at [phone] (available 24/7). ? This guidance is subject to change. For the most up-to-date guidance from [facility], please refer to their website: YouBlogs.pl

## 2018-11-25 NOTE — ED Notes (Signed)
Dr. Smith in to assess pt at this time 

## 2018-11-25 NOTE — Progress Notes (Signed)
Evaluated patient who presented for complaints of fever, malaise, body aches, headaches, and productive cough over the last 6 days.  Patient without oxygen requirement even with ambulation.  Chest x-ray otherwise clear.  Labs revealed WBC elevated 11.3 with potassium mildly low at 3.4.  Patient appears to be otherwise healthy.  On physical exam he does have some mild tachypnea, but does not look to be in distress or have accessory muscle usage noted or wheezing. Would recommend giving patient albuterol inhaler given in emergency department.  As patient is not hypoxic would not necessarily recommend steroids.  Would recommend discharge home with specific instructions regarding worsening of symptoms and need of returning to the hospital.  Along with continued counseling on the need of use of Tylenol and to avoid NSAIDs.

## 2018-11-25 NOTE — ED Triage Notes (Signed)
Pt with fevers, cough, chills since Wednesday. He is unsure of exposure [place]. Using OTC meds no relief. Strong cough at traige.

## 2018-12-02 ENCOUNTER — Other Ambulatory Visit: Payer: Self-pay

## 2018-12-02 ENCOUNTER — Telehealth (INDEPENDENT_AMBULATORY_CARE_PROVIDER_SITE_OTHER): Payer: 59 | Admitting: Family Medicine

## 2018-12-02 ENCOUNTER — Encounter: Payer: Self-pay | Admitting: Family Medicine

## 2018-12-02 ENCOUNTER — Telehealth: Payer: Self-pay | Admitting: Family Medicine

## 2018-12-02 DIAGNOSIS — U071 COVID-19: Secondary | ICD-10-CM | POA: Insufficient documentation

## 2018-12-02 DIAGNOSIS — Z20822 Contact with and (suspected) exposure to covid-19: Secondary | ICD-10-CM

## 2018-12-02 MED ORDER — BENZONATATE 100 MG PO CAPS: 100.0000 mg | ORAL_CAPSULE | Freq: Three times a day (TID) | ORAL | 0 refills | Status: DC | PRN

## 2018-12-02 NOTE — Progress Notes (Signed)
F/u for pos covid.  Work wants to another test before going back to work  Cough still there and feeling better.  Last night trouble breathing.

## 2018-12-02 NOTE — Patient Instructions (Signed)
° ° ° °  If you have lab work done today you will be contacted with your lab results within the next 2 weeks.  If you have not heard from us then please contact us. The fastest way to get your results is to register for My Chart. ° ° °IF you received an x-ray today, you will receive an invoice from [facility]. Please contact [facility] at [phone] with questions or concerns regarding your invoice.  ° °IF you received labwork today, you will receive an invoice from [facility]. Please contact [facility] at [phone] with questions or concerns regarding your invoice.  ° °Our billing staff will not be able to assist you with questions regarding bills from these companies. ° °You will be contacted with the lab results as soon as they are available. The fastest way to get your results is to activate your My Chart account. Instructions are located on the last page of this paperwork. If you have not heard from us regarding the results in 2 weeks, please contact this office. °  ° ° ° °

## 2018-12-02 NOTE — Telephone Encounter (Signed)
+  COVID 11/25/18-pt needed repeat testing to RTW Afebrile x 1 week Continued cough

## 2018-12-02 NOTE — Telephone Encounter (Signed)
Spoke with patient, scheduled him for COVID 19 test tomorrow at [facility] at 11 am.  Testing protocol reviewed.

## 2018-12-02 NOTE — Addendum Note (Signed)
Addended by: Denyce Robert on: 12/02/2018 12:53 PM   Modules accepted: Orders

## 2018-12-02 NOTE — Progress Notes (Signed)
Telemedicine Encounter- SOAP NOTE Established Patient  I discussed the limitations, risks, security and privacy concerns of performing an evaluation and management service by telephone and the availability of in person appointments. I also discussed with the patient that there may be a patient responsible charge related to this service. The patient expressed understanding and agreed to proceed.  This telephone encounter was conducted with the patient vebal consent via audio telecommunications: yes Patient was instructed to have this encounter in a suitably private space; and to only have persons present to whom they give permission to participate. In addition, patient identity was confirmed by use of name plus two identifiers (DOB and address).  I spent a total of 17min talking with the patient     Subjective   Travis Estrada is a 33 y.o. male established patient. Telephone visit today for positive COVID. Pt is being required to return to work having another COVID test.   HPI Pt with diagnosis of COVID 1 weeks ago at the ER-pt has taken robitussin and tessalon with slow improvement. Pt no longer has temperature elevation. Pt states he must have a repeat test to return to work.  Pt is using tessalon-ran out yesterday and request refill. Pt using robitussin and albuterol for cough.    Patient Active Problem List   Diagnosis Date Noted  . Testicular pain, right 09/25/2018  . Prediabetes 12/04/2017  . Adjustment disorder with mixed anxiety and depressed mood 02/11/2017  . Chronic pain of right knee 11/07/2016    Past Medical History:  Diagnosis Date  . Pterygium     Current Outpatient Medications  Medication Sig Dispense Refill  . acetaminophen (TYLENOL 8 HOUR) 650 MG CR tablet Take 1 tablet (650 mg total) by mouth every 8 (eight) hours as needed for pain. (Patient not taking: Reported on 12/02/2018) 15 tablet 0  . benzonatate (TESSALON) 100 MG capsule Take 1 capsule (100 mg total)  by mouth 3 (three) times daily as needed for cough. (Patient not taking: Reported on 12/02/2018) 15 capsule 0  . guaiFENesin (ROBITUSSIN) 100 MG/5ML liquid Take 100 mg by mouth 3 (three) times daily as needed for cough.     No current facility-administered medications for this visit.     No Known Allergies  Social History   Socioeconomic History  . Marital status: Married    Spouse name: Not on file  . Number of children: Not on file  . Years of education: Not on file  . Highest education level: Not on file  Occupational History  . Not on file  Social Needs  . Financial resource strain: Not on file  . Food insecurity    Worry: Not on file    Inability: Not on file  . Transportation needs    Medical: Not on file    Non-medical: Not on file  Tobacco Use  . Smoking status: Never Smoker  . Smokeless tobacco: Never Used  Substance and Sexual Activity  . Alcohol use: Yes    Alcohol/week: 5.0 standard drinks    Types: 5 Cans of beer per week  . Drug use: No  . Sexual activity: Not on file  Lifestyle  . Physical activity    Days per week: Not on file    Minutes per session: Not on file  . Stress: Not on file  Relationships  . Social Herbalist on phone: Not on file    Gets together: Not on file    Attends religious  service: Not on file    Active member of club or organization: Not on file    Attends meetings of clubs or organizations: Not on file    Relationship status: Not on file  . Intimate partner violence    Fear of current or ex partner: Not on file    Emotionally abused: Not on file    Physically abused: Not on file    Forced sexual activity: Not on file  Other Topics Concern  . Not on file  Social History Narrative  . Not on file    Review of Systems  Constitutional: Positive for malaise/fatigue. Negative for fever.  Respiratory: Positive for cough. Negative for shortness of breath.     Objective   Vitals as reported by the patient: No  vitals  1. COVID-19 virus infection Pt needs repeat testing to return to work Cough continues-tessalon-rx, continue robitussin and albuterol-no h/o lung disease I discussed the assessment and treatment plan with the patient. The patient was provided an opportunity to ask questions and all were answered. The patient agreed with the plan and demonstrated an understanding of the instructions.   The patient was advised to call back or seek an in-person evaluation if the symptoms worsen or if the condition fails to improve as anticipated.  I provided 10 minutes of non-face-to-face time during this encounter.  Sherrol Vicars Mat CarneLEIGH Bates [place], MD  [facility] 12-03-2018

## 2018-12-03 ENCOUNTER — Other Ambulatory Visit: Payer: Self-pay | Admitting: Internal Medicine

## 2018-12-03 ENCOUNTER — Other Ambulatory Visit: Payer: 59

## 2018-12-03 LAB — NOVEL CORONAVIRUS, NAA: SARS-CoV-2, NAA: NOT DETECTED

## 2018-12-05 ENCOUNTER — Encounter: Payer: Self-pay | Admitting: Hematology

## 2018-12-11 ENCOUNTER — Telehealth: Payer: Self-pay | Admitting: General Practice

## 2018-12-11 NOTE — Telephone Encounter (Signed)
Pt needs a copy of his COVID19 result for work. Please call pt when result is ready for pick up.

## 2018-12-15 NOTE — Telephone Encounter (Signed)
Called pt and informed paper work is ready to pick up, he verbalize understanding.

## 2019-02-23 IMAGING — MR MR KNEE*R* W/O CM
4 of 6 series · 22 of 40 positions shown · non-contrast
Comparison: None.

CLINICAL DATA: Right knee pain for 2-3 years.

EXAM:
MRI OF THE RIGHT KNEE WITHOUT CONTRAST
TECHNIQUE: Multiplanar, multisequence MR imaging of the knee was performed. No
intravenous contrast was administered.

[Series 3: PD fat-sat · axial · 4.0mm · 0.42mm/px · z∈[-54,+76]mm · 8 of 28 slices shown (1 of 3)]
[im 1/28]
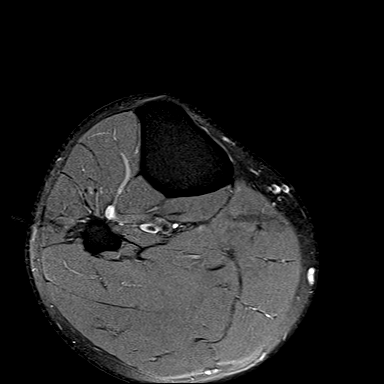
[im 4/28]
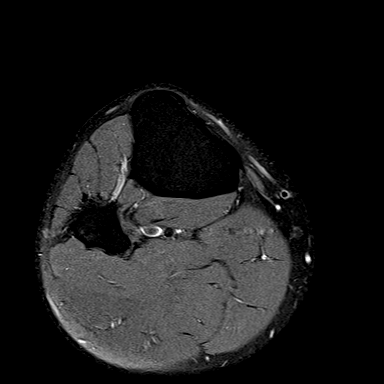
[im 8/28]
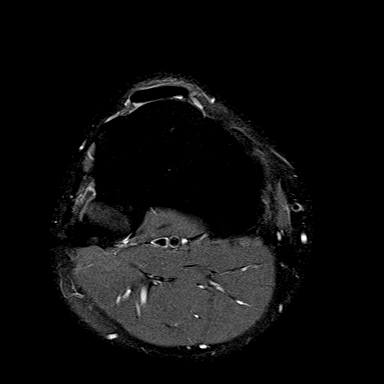
[im 12/28]
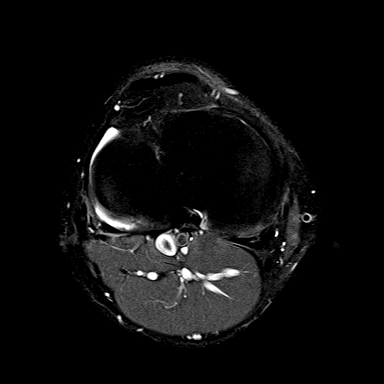
[im 16/28]
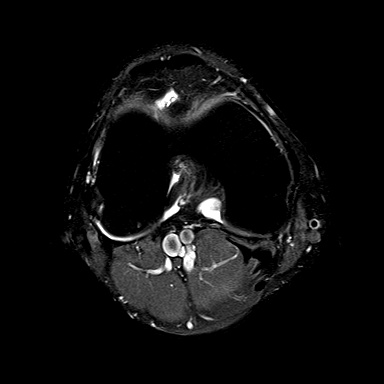
[im 20/28]
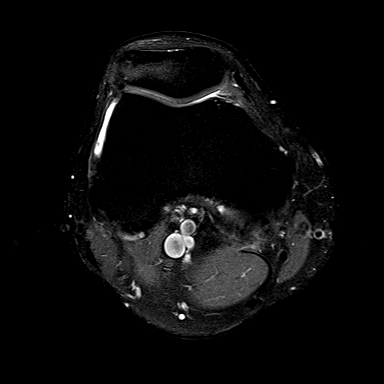
[im 24/28]
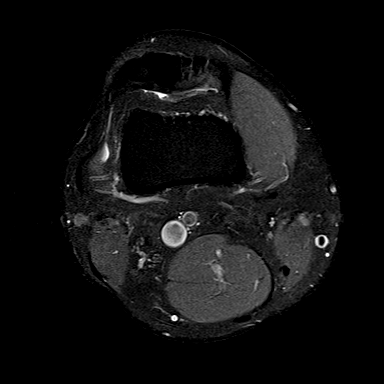
[im 28/28]
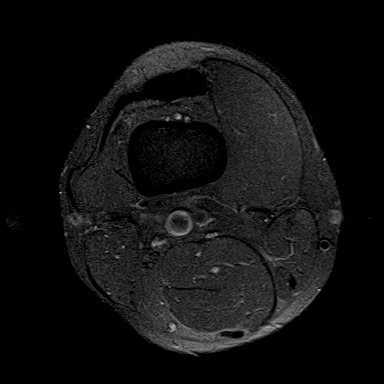

[Series 5: T2 fat-sat · coronal · 3.0mm · 0.29mm/px · 3 of 29 slices shown]
[im 5/29]
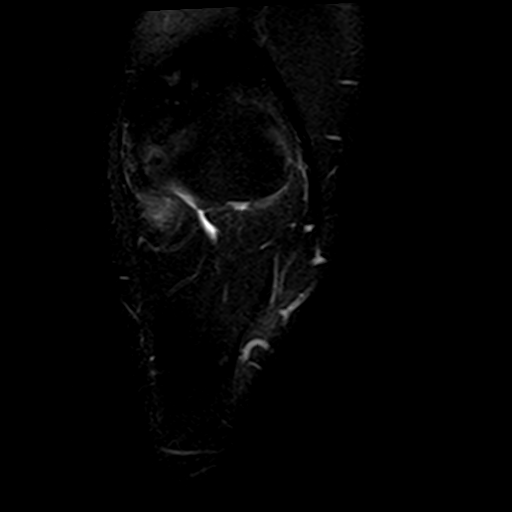
[im 15/29]
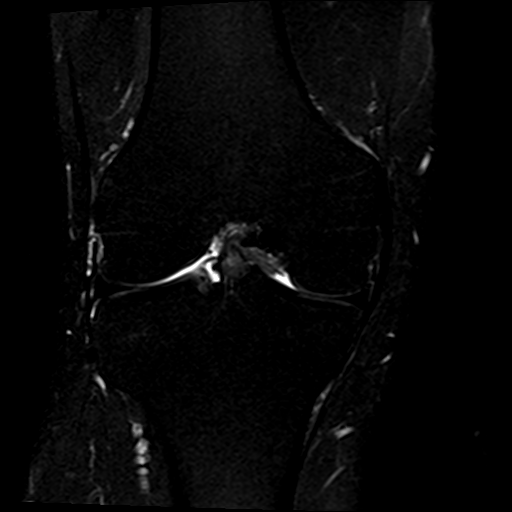
[im 24/29]
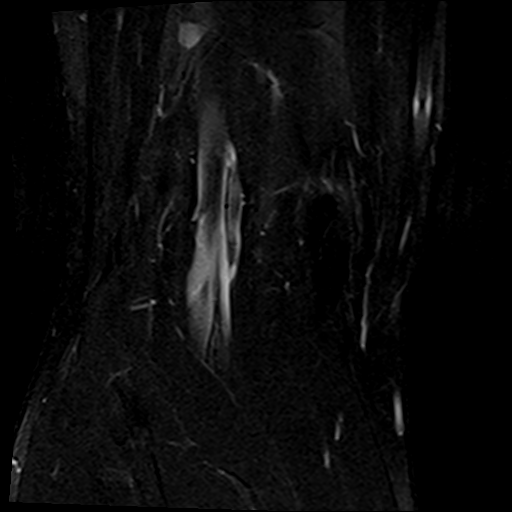

[Series 6: PD fat-sat · sagittal · 4.0mm · 0.31mm/px · 5 of 21 slices shown (2 of 3)]
[im 1/21]
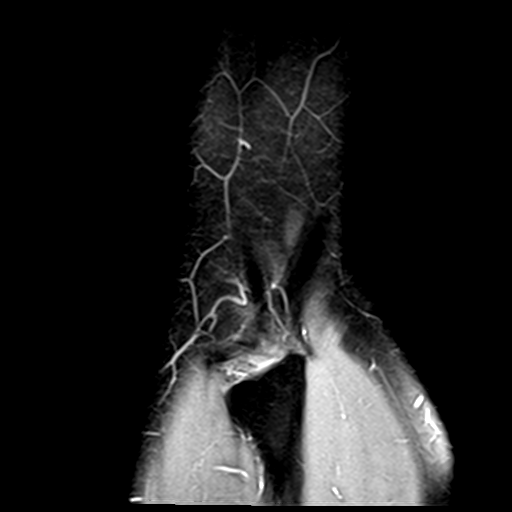
[im 6/21]
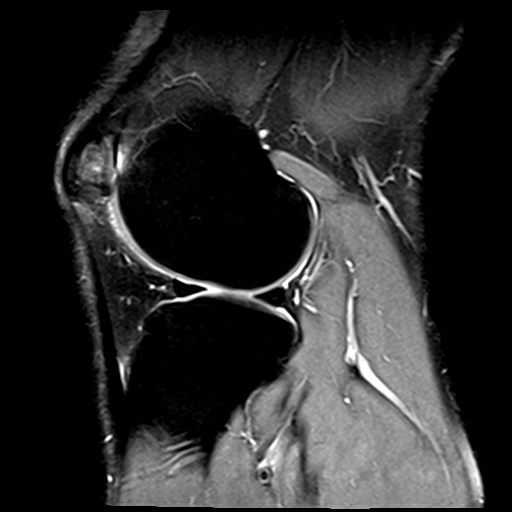
[im 11/21]
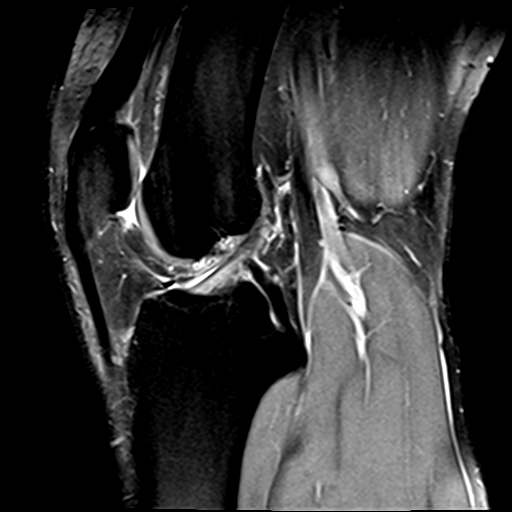
[im 16/21]
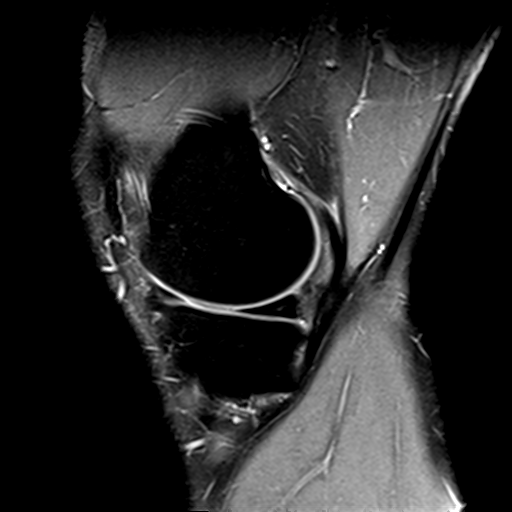
[im 21/21]
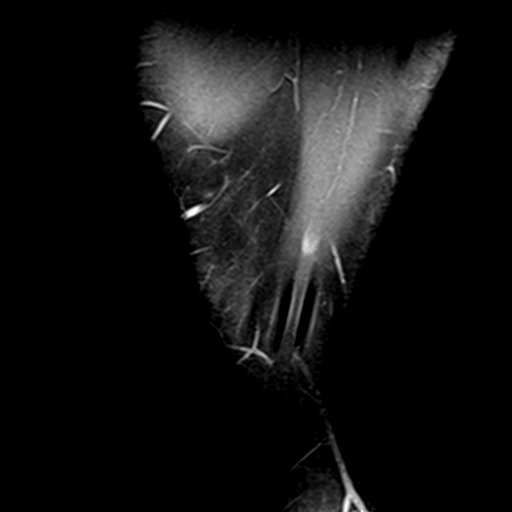

[Series 7: PD fat-sat · coronal · 3.0mm · 0.29mm/px · 6 of 29 slices shown (3 of 3)]
[im 1/29]
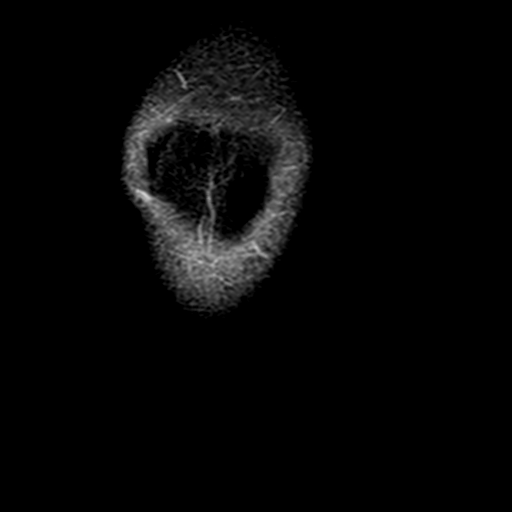
[im 5/29]
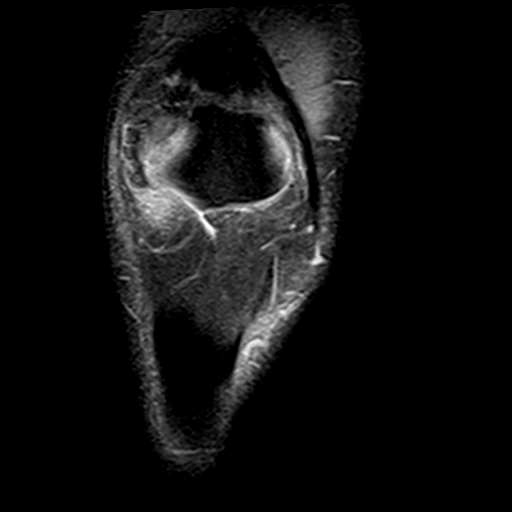
[im 10/29]
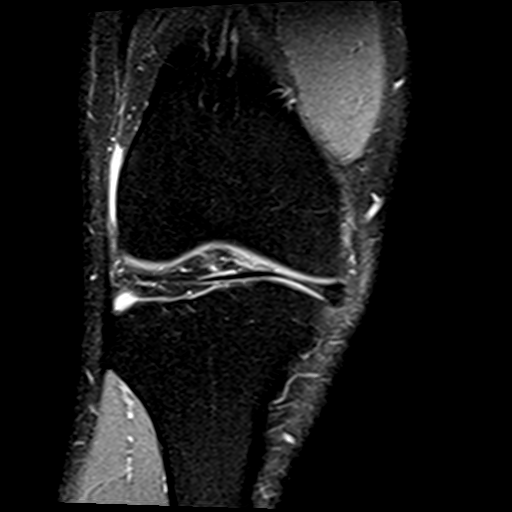
[im 15/29]
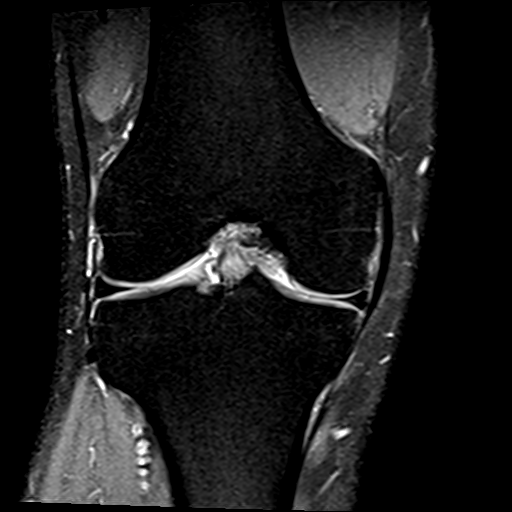
[im 19/29]
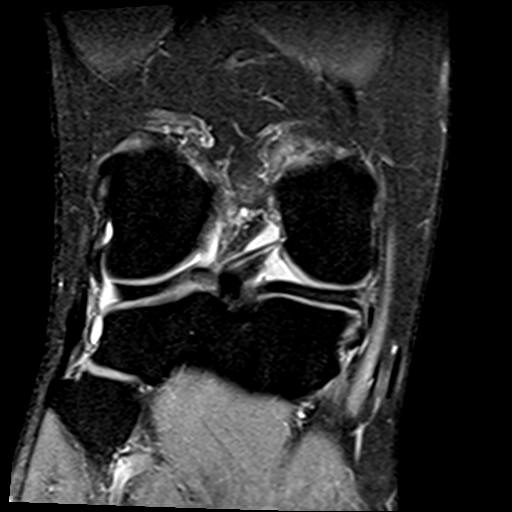
[im 24/29]
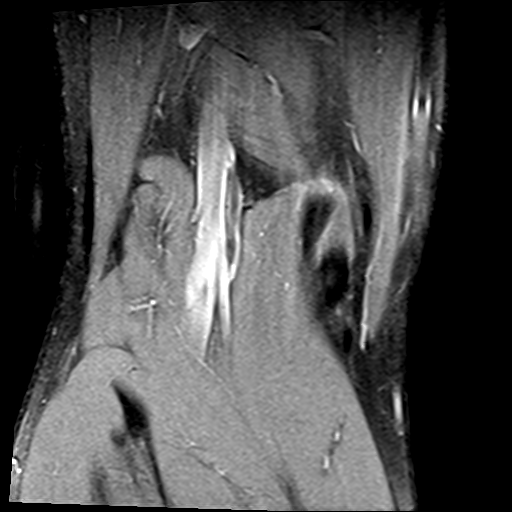

[22 of 40 positions shown; findings below may reference images not displayed]

FINDINGS: MENISCI

Medial meniscus:  Intact.  Mild intrasubstance degenerative changes.

Lateral meniscus:  Intact

LIGAMENTS

Cruciates:  Intact

Collaterals:  Intact

CARTILAGE

Patellofemoral:  Normal

Medial:  Normal

Lateral:  Normal

Joint:  No joint effusion.

Popliteal Fossa:  No popliteal mass or Baker's cyst.

Extensor Mechanism: The tripartite patella is noted with some edema
like signal abnormality in the components which may be due to
ongoing stress related process. Normal articular cartilage coverage.
The quadriceps and patellar tendons are intact. The patella
retinacular structures are intact.

Bones: No acute bony findings. Mild marrow edema along the
tripartite patella. No bone contusion or osteochondral abnormality.

Probable osteochondroma involving the fibular neck.

Other: The knee musculature appears normal.
IMPRESSION: 1. Intact ligamentous structures and no acute bony findings.
2. No meniscal tears and intact articular cartilage.
3. Tripartite patella with marrow edema but normal articular
cartilage coverage.
4. No joint effusion or Baker's cyst.

## 2019-08-11 ENCOUNTER — Encounter: Payer: Self-pay | Admitting: Emergency Medicine

## 2019-08-11 ENCOUNTER — Telehealth (INDEPENDENT_AMBULATORY_CARE_PROVIDER_SITE_OTHER): Payer: 59 | Admitting: Emergency Medicine

## 2019-08-11 ENCOUNTER — Other Ambulatory Visit: Payer: Self-pay

## 2019-08-11 VITALS — BP 118/73 | HR 67 | Temp 98.3°F | Ht 65.0 in | Wt 197.8 lb

## 2019-08-11 DIAGNOSIS — R0600 Dyspnea, unspecified: Secondary | ICD-10-CM | POA: Diagnosis not present

## 2019-08-11 DIAGNOSIS — Z8616 Personal history of COVID-19: Secondary | ICD-10-CM | POA: Diagnosis not present

## 2019-08-11 MED ORDER — ALBUTEROL SULFATE HFA 108 (90 BASE) MCG/ACT IN AERS: 2.0000 | INHALATION_SPRAY | Freq: Four times a day (QID) | RESPIRATORY_TRACT | 3 refills | Status: AC | PRN

## 2019-08-11 NOTE — Progress Notes (Signed)
Travis Estrada 34 y.o.   Chief Complaint  Patient presents with  . Shortness of Breath    Pt stated that for the last 3 weeks he has been having trouble breathing throught out the day and at night. Post covid, still having trouble breathing    HISTORY OF PRESENT ILLNESS: [name] complaining of intermittent episodes of shortness of breath for the past 3 weeks. Had Covid infection last summer. No other associated symptoms. Non-smoker.  No history of asthma.  No occupational history.  No history of diabetes or heart conditions. No significant past medical history.  No chronic medical problems. Stress level average.  No more than usual.  Works Architect regular hours, mostly supervision. No risk factors for PE.  HPI   Prior to Admission medications   Not on File    No Known Allergies  Patient Active Problem List   Diagnosis Date Noted  . COVID-19 virus infection 12/02/2018  . Testicular pain, right 09/25/2018  . Prediabetes 12/04/2017  . Adjustment disorder with mixed anxiety and depressed mood 02/11/2017  . Chronic pain of right knee 11/07/2016    Past Medical History:  Diagnosis Date  . Pterygium     History reviewed. No pertinent surgical history.  Social History   Socioeconomic History  . Marital status: Married    Spouse name: Not on file  . Number of children: Not on file  . Years of education: Not on file  . Highest education level: Not on file  Occupational History  . Not on file  Tobacco Use  . Smoking status: Never Smoker  . Smokeless tobacco: Never Used  Substance and Sexual Activity  . Alcohol use: Yes    Alcohol/week: 5.0 standard drinks    Types: 5 Cans of beer per week  . Drug use: No  . Sexual activity: Not on file  Other Topics Concern  . Not on file  Social History Narrative  . Not on file   Social Determinants of Health   Financial Resource Strain:   . Difficulty of Paying Living Expenses:   Food Insecurity:   .  Worried About Charity fundraiser in the Last Year:   . Arboriculturist in the Last Year:   Transportation Needs:   . Film/video editor (Medical):   Marland Kitchen Lack of Transportation (Non-Medical):   Physical Activity:   . Days of Exercise per Week:   . Minutes of Exercise per Session:   Stress:   . Feeling of Stress :   Social Connections:   . Frequency of Communication with Friends and Family:   . Frequency of Social Gatherings with Friends and Family:   . Attends Religious Services:   . Active Member of Clubs or Organizations:   . Attends Archivist Meetings:   Marland Kitchen Marital Status:   Intimate Partner Violence:   . Fear of Current or Ex-Partner:   . Emotionally Abused:   Marland Kitchen Physically Abused:   . Sexually Abused:     Family History  Problem Relation Age of Onset  . Diabetes Mother   . Hypertension Sister      Review of Systems  Constitutional: Negative.  Negative for chills and fever.  HENT: Negative.  Negative for congestion and sore throat.   Respiratory: Negative.  Negative for cough and shortness of breath.   Cardiovascular: Negative.  Negative for chest pain and palpitations.  Gastrointestinal: Negative.  Negative for abdominal pain, diarrhea, nausea and vomiting.  Genitourinary: Negative.  Negative for dysuria and hematuria.  Musculoskeletal: Negative.  Negative for back pain, joint pain, myalgias and neck pain.  Skin: Negative.  Negative for rash.  Neurological: Negative for dizziness and headaches.  All other systems reviewed and are negative.  Vitals:   08/11/19 1721  BP: 118/73  Pulse: 67  Temp: 98.3 F (36.8 C)  SpO2: 96%    Physical Exam Vitals reviewed.  Constitutional:      Appearance: He is well-developed.  HENT:     Head: Normocephalic.     Mouth/Throat:     Mouth: Mucous membranes are moist.     Pharynx: Oropharynx is clear.  Eyes:     Extraocular Movements: Extraocular movements intact.     Conjunctiva/sclera: Conjunctivae normal.       Pupils: Pupils are equal, round, and reactive to light.  Cardiovascular:     Rate and Rhythm: Normal rate and regular rhythm.     Pulses: Normal pulses.     Heart sounds: Normal heart sounds.  Pulmonary:     Effort: Pulmonary effort is normal.     Breath sounds: Normal breath sounds.  Abdominal:     General: There is no distension.     Palpations: Abdomen is soft.     Tenderness: There is no abdominal tenderness.  Musculoskeletal:        General: No swelling or tenderness. Normal range of motion.     Cervical back: Normal range of motion and neck supple.     Right lower leg: No edema.     Left lower leg: No edema.  Skin:    General: Skin is warm and dry.     Capillary Refill: Capillary refill takes less than 2 seconds.  Neurological:     General: No focal deficit present.     Mental Status: He is alert and oriented to person, place, and time.  Psychiatric:        Mood and Affect: Mood normal.        Behavior: Behavior normal.    A total of 30 minutes was spent with the patient, greater than 50% of which was in counseling/coordination of care regarding differential diagnosis, need for diagnostic work-up, prognosis and need for follow-up.   ASSESSMENT & PLAN: Travis Estrada was seen today for shortness of breath.  Diagnoses and all orders for this visit:  Dyspnea, unspecified type -     CBC with Differential/Platelet -     TSH -     Hemoglobin A1c -     Comprehensive metabolic panel -     HIV Antibody (routine testing w rflx) -     albuterol (VENTOLIN HFA) 108 (90 Base) MCG/ACT inhaler; Inhale 2 puffs into the lungs every 6 (six) hours as needed for wheezing or shortness of breath.  History of COVID-19 -     SAR CoV2 Serology (COVID 19)AB(IGG)IA    Patient Instructions       If you have lab work done today you will be contacted with your lab results within the next 2 weeks.  If you have not heard from [place] then please contact us. The fastest way to get your results is to  register for My Chart.   IF you received an x-ray today, you will receive an invoice from [facility]. Please contact [facility] at [phone] with questions or concerns regarding your invoice.   IF you received labwork today, you will receive an invoice from [place]. Please contact [facility] at [phone] with  questions or concerns regarding your invoice.   Our billing staff will not be able to assist you with questions regarding bills from these companies.  You will be contacted with the lab results as soon as they are available. The fastest way to get your results is to activate your My Chart account. Instructions are located on the last page of this paperwork. If you have not heard from [place] regarding the results in 2 weeks, please contact this office.     Disnea en los adultos Shortness of Breath, Adult La disnea significa que tiene dificultad para respirar. La disnea puede ser un signo de un problema mdico. Siga estas indicaciones en su casa:   Controle si hay algn cambio en sus sntomas.  No consuma ningn producto que contenga nicotina o tabaco, como cigarrillos, cigarrillos electrnicos y tabaco de Theatre manager.  No fume. Fumar puede causar disnea. Si necesita ayuda para dejar de fumar, consulte con el mdico.  Evite todo aquello que puede dificultar la respiracin, por ejemplo: ? Moho. ? Polvo. ? Contaminacin del aire. ? Olores de productos qumicos. ? Cosas que causan sntomas de alergia (alrgenos), si tiene Environmental consultant.  Mantenga la limpieza del espacio en que vive. Use productos que ayuden a Land moho y el polvo.  Descanse todo lo que sea necesario. Retome gradualmente sus actividades habituales.  Tome los medicamentos de venta libre y los recetados solamente como se lo haya indicado el mdico. Esto incluye la oxigenoterapia y los medicamentos inhalados.  Concurra a todas las visitas de [address] se lo haya indicado el mdico. Esto es  importante. Comunquese con un mdico si:  Su afeccin no se alivia tan pronto como se espera.  Le cuesta hacer las actividades cotidianas, incluso despus de Lawyer.  Aparecen nuevos sntomas. Solicite ayuda inmediatamente si:  La disnea empeora.  Tiene dificultad para respirar cuando descansa.  Se siente mareada o se desvanece (se desmaya).  Tiene tos que no se alivia con medicamentos.  Tose y escupe sangre.  Siente dolor al respirar.  Tiene dolor en el pecho, los brazos, los hombros o el vientre (abdomen).  Tiene fiebre.  No puede subir escaleras.  No puede realizar ejercicio del modo en que lo haca habitualmente. Estos sntomas pueden representar un problema grave que constituye Radio broadcast assistant. No espere a ver si los sntomas desaparecen. Solicite atencin mdica de inmediato. Comunquese con el servicio de emergencias de su localidad (911 en los [place]). No conduzca por sus propios medios [employer]. Resumen  La disnea se produce cuando tiene dificultad para inhalar la suficiente cantidad de aire. Puede ser signo de un problema mdico.  Evite las cosas que le dificultan la respiracin, como el fumar, la contaminacin, el moho y [place].  Controle si hay algn cambio en sus sntomas. Comunquese con el mdico si no mejora o si empeora. Esta informacin no tiene Theme park manager el consejo del mdico. Asegrese de hacerle al mdico cualquier pregunta que tenga. Document Revised: 11/19/2017 Document Reviewed: 11/19/2017 Elsevier Patient Education  2020 Elsevier Inc.      Edwina Barth, MD [facility]

## 2019-08-11 NOTE — Patient Instructions (Addendum)
If you have lab work done today you will be contacted with your lab results within the next 2 weeks.  If you have not heard from [place] then please contact us. The fastest way to get your results is to register for My Chart.   IF you received an x-ray today, you will receive an invoice from [facility]. Please contact [facility] at [phone] with questions or concerns regarding your invoice.   IF you received labwork today, you will receive an invoice from [place]. Please contact [facility] at [phone] with questions or concerns regarding your invoice.   Our billing staff will not be able to assist you with questions regarding bills from these companies.  You will be contacted with the lab results as soon as they are available. The fastest way to get your results is to activate your My Chart account. Instructions are located on the last page of this paperwork. If you have not heard from [place] regarding the results in 2 weeks, please contact this office.     Disnea en los adultos Shortness of Breath, Adult La disnea significa que tiene dificultad para respirar. La disnea puede ser un signo de un problema mdico. Siga estas indicaciones en su casa:   Controle si hay algn cambio en sus sntomas.  No consuma ningn producto que contenga nicotina o tabaco, como cigarrillos, cigarrillos electrnicos y tabaco de Higher education careers adviser.  No fume. Fumar puede causar disnea. Si necesita ayuda para dejar de fumar, consulte con el mdico.  Evite todo aquello que puede dificultar la respiracin, por ejemplo: ? Moho. ? Polvo. ? Contaminacin del aire. ? Olores de productos qumicos. ? Cosas que causan sntomas de alergia (alrgenos), si tiene Set designer.  Mantenga la limpieza del espacio en que vive. Use productos que ayuden a Tour manager moho y el polvo.  Descanse todo lo que sea necesario. Retome gradualmente sus actividades habituales.  Tome los medicamentos de venta libre y los  recetados solamente como se lo haya indicado el mdico. Esto incluye la oxigenoterapia y los medicamentos inhalados.  Concurra a todas las visitas de seguimiento como se lo haya indicado el mdico. Esto es importante. Comunquese con un mdico si:  Su afeccin no se alivia tan pronto como se espera.  Le cuesta hacer las actividades cotidianas, incluso despus de Production assistant, radio.  Aparecen nuevos sntomas. Solicite ayuda inmediatamente si:  La disnea empeora.  Tiene dificultad para respirar cuando descansa.  Se siente mareada o se desvanece (se desmaya).  Tiene tos que no se alivia con medicamentos.  Tose y escupe sangre.  Siente dolor al respirar.  Tiene dolor en el pecho, los brazos, los hombros o el vientre (abdomen).  Tiene fiebre.  No puede subir escaleras.  No puede realizar ejercicio del modo en que lo haca habitualmente. Estos sntomas pueden representar un problema grave que constituye Engineer, maintenance (IT). No espere a ver si los sntomas desaparecen. Solicite atencin mdica de inmediato. Comunquese con el servicio de emergencias de su localidad (911 en los [place]). No conduzca por sus propios medios [employer]. Resumen  La disnea se produce cuando tiene dificultad para inhalar la suficiente cantidad de aire. Puede ser signo de un problema mdico.  Evite las cosas que le dificultan la respiracin, como el fumar, la contaminacin, el moho y [place].  Controle si hay algn cambio en sus sntomas. Comunquese con el mdico si no mejora o si empeora. Esta informacin no tiene Marine scientist el consejo del mdico. Chief Strategy Officer  de hacerle al mdico cualquier pregunta que tenga. Document Revised: 11/19/2017 Document Reviewed: 11/19/2017 Elsevier Patient Education  2020 [employer].

## 2019-08-12 LAB — COMPREHENSIVE METABOLIC PANEL
ALT: 21 IU/L (ref 0–44)
AST: 26 IU/L (ref 0–40)
Albumin/Globulin Ratio: 1.9 (ref 1.2–2.2)
Albumin: 4.6 g/dL (ref 4.0–5.0)
Alkaline Phosphatase: 78 IU/L (ref 39–117)
BUN/Creatinine Ratio: 15 (ref 9–20)
BUN: 14 mg/dL (ref 6–20)
Bilirubin Total: 0.3 mg/dL (ref 0.0–1.2)
CO2: 22 mmol/L (ref 20–29)
Calcium: 9.1 mg/dL (ref 8.7–10.2)
Chloride: 105 mmol/L (ref 96–106)
Creatinine, Ser: 0.92 mg/dL (ref 0.76–1.27)
GFR calc Af Amer: 125 mL/min/{1.73_m2} (ref >59)
GFR calc non Af Amer: 108 mL/min/{1.73_m2} (ref >59)
Globulin, Total: 2.4 g/dL (ref 1.5–4.5)
Glucose: 109 mg/dL — ABNORMAL HIGH (ref 65–99)
Potassium: 4 mmol/L (ref 3.5–5.2)
Sodium: 142 mmol/L (ref 134–144)
Total Protein: 7 g/dL (ref 6.0–8.5)

## 2019-08-12 LAB — CBC WITH DIFFERENTIAL/PLATELET
Basophils Absolute: 0.1 10*3/uL (ref 0.0–0.2)
Basos: 1 %
EOS (ABSOLUTE): 0.2 10*3/uL (ref 0.0–0.4)
Eos: 2 %
Hematocrit: 41.8 % (ref 37.5–51.0)
Hemoglobin: 14.2 g/dL (ref 13.0–17.7)
Immature Grans (Abs): 0 10*3/uL (ref 0.0–0.1)
Immature Granulocytes: 0 %
Lymphocytes Absolute: 1.9 10*3/uL (ref 0.7–3.1)
Lymphs: 27 %
MCH: 30.3 pg (ref 26.6–33.0)
MCHC: 34 g/dL (ref 31.5–35.7)
MCV: 89 fL (ref 79–97)
Monocytes Absolute: 0.7 10*3/uL (ref 0.1–0.9)
Monocytes: 10 %
Neutrophils Absolute: 4.2 10*3/uL (ref 1.4–7.0)
Neutrophils: 60 %
Platelets: 257 10*3/uL (ref 150–450)
RBC: 4.69 x10E6/uL (ref 4.14–5.80)
RDW: 13.2 % (ref 11.6–15.4)
WBC: 7.1 10*3/uL (ref 3.4–10.8)

## 2019-08-12 LAB — HEMOGLOBIN A1C
Est. average glucose Bld gHb Est-mCnc: 126 mg/dL
Hgb A1c MFr Bld: 6 % — ABNORMAL HIGH (ref 4.8–5.6)

## 2019-08-12 LAB — SAR COV2 SEROLOGY (COVID19)AB(IGG),IA: DiaSorin SARS-CoV-2 Ab, IgG: POSITIVE

## 2019-08-12 LAB — TSH: TSH: 1.41 u[IU]/mL (ref 0.450–4.500)

## 2019-08-12 LAB — HIV ANTIBODY (ROUTINE TESTING W REFLEX): HIV Screen 4th Generation wRfx: NONREACTIVE

## 2019-08-18 ENCOUNTER — Ambulatory Visit: Payer: 59 | Admitting: Emergency Medicine

## 2019-08-19 ENCOUNTER — Encounter: Payer: Self-pay | Admitting: Emergency Medicine

## 2019-09-10 ENCOUNTER — Encounter: Payer: 59 | Admitting: Emergency Medicine

## 2019-09-14 ENCOUNTER — Encounter: Payer: Self-pay | Admitting: Emergency Medicine

## 2021-01-24 IMAGING — DX PORTABLE CHEST - 1 VIEW
1 series · 1 of 1 positions shown · non-contrast
Comparison: None.

CLINICAL DATA: Fevers, cough, chills.

EXAM:
PORTABLE CHEST 1 VIEW

[chest]
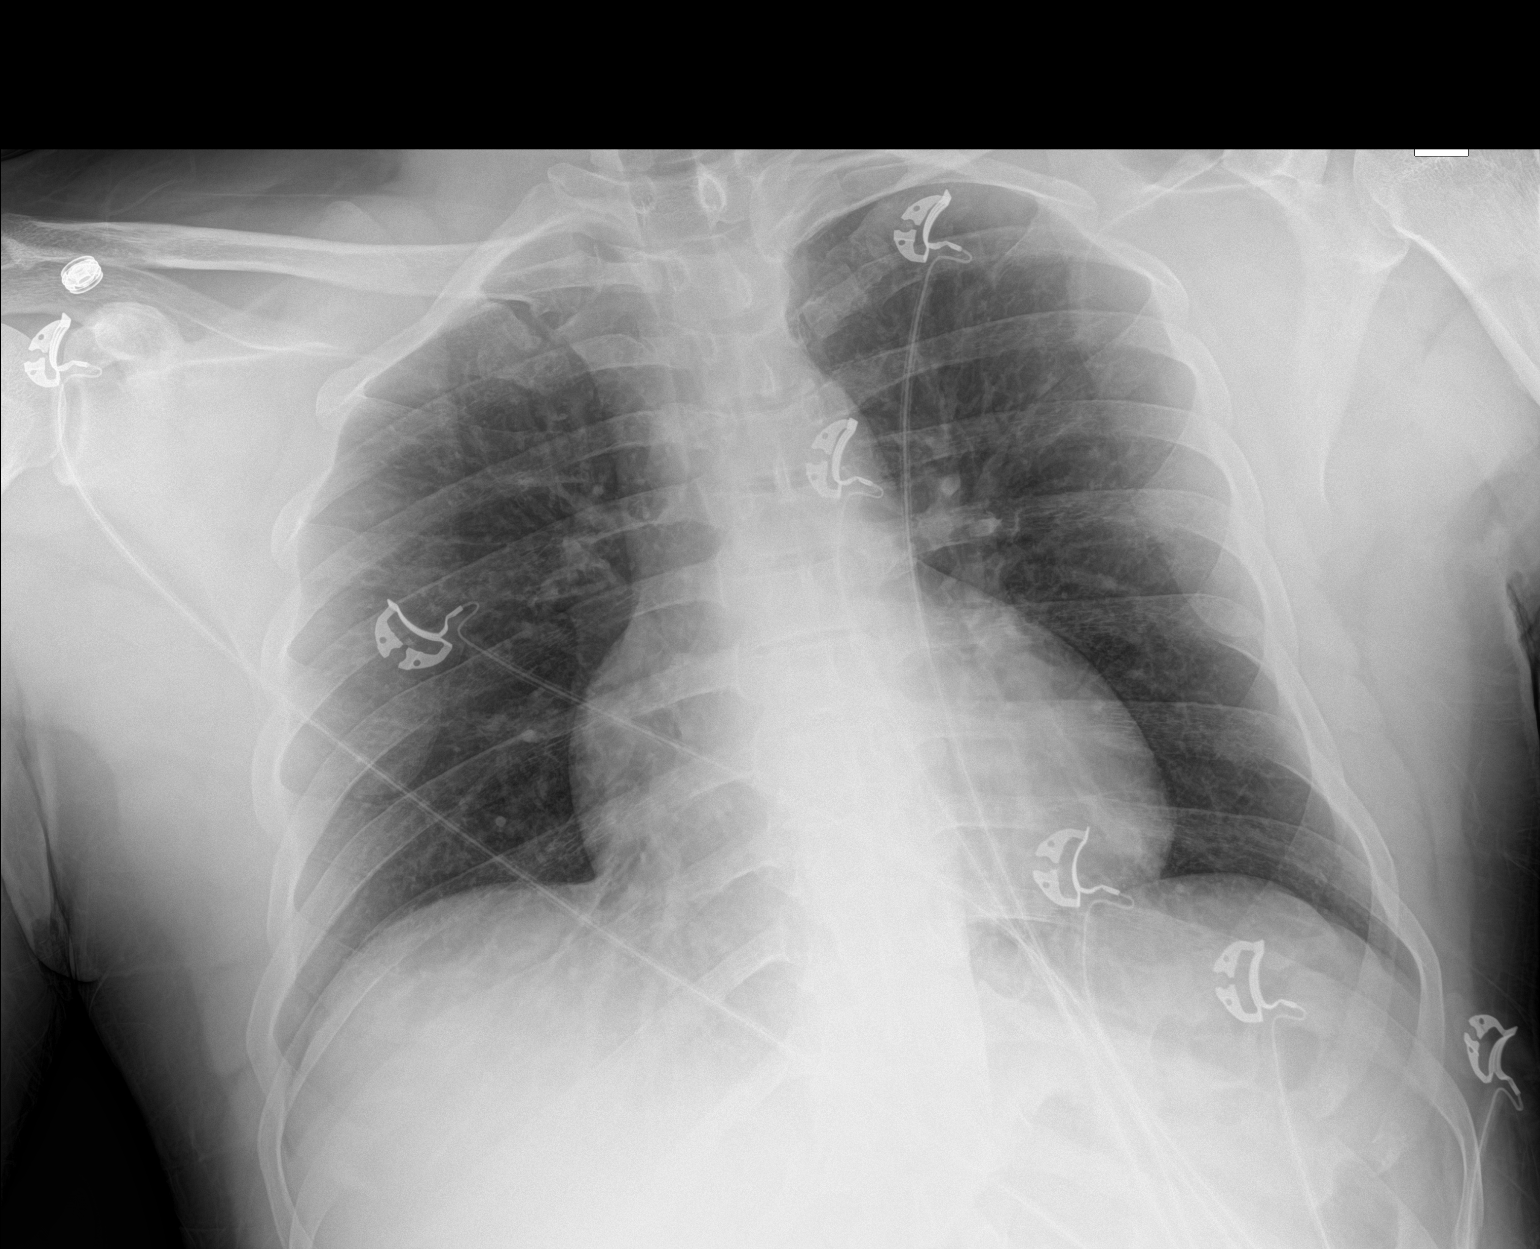

[1 of 1 positions shown; findings below may reference images not displayed]

FINDINGS: The heart size and mediastinal contours are within normal limits.
Both lungs are clear. The visualized skeletal structures are
unremarkable.
IMPRESSION: No active disease.

## 2021-08-17 ENCOUNTER — Emergency Department (HOSPITAL_COMMUNITY)
Admission: EM | Admit: 2021-08-17 | Discharge: 2021-08-17 | Disposition: A | Discharge status: Home / Self Care | Payer: 59 | Attending: Emergency Medicine | Admitting: Emergency Medicine

## 2021-08-17 ENCOUNTER — Encounter (HOSPITAL_COMMUNITY): Payer: Self-pay | Admitting: Emergency Medicine

## 2021-08-17 ENCOUNTER — Emergency Department (HOSPITAL_COMMUNITY): Payer: 59

## 2021-08-17 DIAGNOSIS — R112 Nausea with vomiting, unspecified: Secondary | ICD-10-CM | POA: Diagnosis present

## 2021-08-17 DIAGNOSIS — R197 Diarrhea, unspecified: Secondary | ICD-10-CM | POA: Insufficient documentation

## 2021-08-17 DIAGNOSIS — R1033 Periumbilical pain: Secondary | ICD-10-CM | POA: Diagnosis not present

## 2021-08-17 DIAGNOSIS — R509 Fever, unspecified: Secondary | ICD-10-CM | POA: Insufficient documentation

## 2021-08-17 DIAGNOSIS — Z20822 Contact with and (suspected) exposure to covid-19: Secondary | ICD-10-CM | POA: Insufficient documentation

## 2021-08-17 LAB — COMPREHENSIVE METABOLIC PANEL
ALT: 20 U/L (ref 0–44)
AST: 26 U/L (ref 15–41)
Albumin: 3.8 g/dL (ref 3.5–5.0)
Alkaline Phosphatase: 68 U/L (ref 38–126)
Anion gap: 9 (ref 5–15)
BUN: 10 mg/dL (ref 6–20)
CO2: 25 mmol/L (ref 22–32)
Calcium: 8.3 mg/dL — ABNORMAL LOW (ref 8.9–10.3)
Chloride: 100 mmol/L (ref 98–111)
Creatinine, Ser: 0.92 mg/dL (ref 0.61–1.24)
GFR, Estimated: >60 mL/min (ref >60)
Glucose, Bld: 123 mg/dL — ABNORMAL HIGH (ref 70–99)
Potassium: 3.5 mmol/L (ref 3.5–5.1)
Sodium: 134 mmol/L — ABNORMAL LOW (ref 135–145)
Total Bilirubin: 0.7 mg/dL (ref 0.3–1.2)
Total Protein: 6.9 g/dL (ref 6.5–8.1)

## 2021-08-17 LAB — URINALYSIS, ROUTINE W REFLEX MICROSCOPIC
Bacteria, UA: NONE SEEN
Bilirubin Urine: NEGATIVE
Glucose, UA: NEGATIVE mg/dL
Ketones, ur: NEGATIVE mg/dL
Leukocytes,Ua: NEGATIVE
Nitrite: NEGATIVE
Protein, ur: NEGATIVE mg/dL
Specific Gravity, Urine: 1.005 (ref 1.005–1.030)
pH: 7 (ref 5.0–8.0)

## 2021-08-17 LAB — RESP PANEL BY RT-PCR (FLU A&B, COVID) ARPGX2
Influenza A by PCR: NEGATIVE
Influenza B by PCR: NEGATIVE
SARS Coronavirus 2 by RT PCR: NEGATIVE

## 2021-08-17 LAB — CBC
HCT: 39.2 % (ref 39.0–52.0)
Hemoglobin: 13.2 g/dL (ref 13.0–17.0)
MCH: 29.3 pg (ref 26.0–34.0)
MCHC: 33.7 g/dL (ref 30.0–36.0)
MCV: 86.9 fL (ref 80.0–100.0)
Platelets: 212 10*3/uL (ref 150–400)
RBC: 4.51 MIL/uL (ref 4.22–5.81)
RDW: 13.2 % (ref 11.5–15.5)
WBC: 7.9 10*3/uL (ref 4.0–10.5)
nRBC: 0 % (ref 0.0–0.2)

## 2021-08-17 LAB — LIPASE, BLOOD: Lipase: 30 U/L (ref 11–51)

## 2021-08-17 MED ORDER — IOHEXOL 300 MG/ML  SOLN
100.0000 mL | Freq: Once | INTRAMUSCULAR | Status: AC | PRN
  Administered 2021-08-17: 100 mL via INTRAVENOUS

## 2021-08-17 MED ORDER — DICYCLOMINE HCL 20 MG PO TABS: 20.0000 mg | ORAL_TABLET | Freq: Two times a day (BID) | ORAL | 0 refills | Status: AC

## 2021-08-17 MED ORDER — SODIUM CHLORIDE 0.9 % IV BOLUS
1000.0000 mL | Freq: Once | INTRAVENOUS | Status: AC
  Administered 2021-08-17: 1000 mL via INTRAVENOUS

## 2021-08-17 MED ORDER — ONDANSETRON HCL 4 MG PO TABS: 4.0000 mg | ORAL_TABLET | Freq: Four times a day (QID) | ORAL | 0 refills | Status: AC

## 2021-08-17 MED ORDER — MORPHINE SULFATE (PF) 4 MG/ML IV SOLN
4.0000 mg | Freq: Once | INTRAVENOUS | Status: AC
  Administered 2021-08-17: 4 mg via INTRAVENOUS
  Filled 2021-08-17: qty 1

## 2021-08-17 MED ORDER — ONDANSETRON HCL 4 MG/2ML IJ SOLN
4.0000 mg | Freq: Once | INTRAMUSCULAR | Status: AC
  Administered 2021-08-17: 4 mg via INTRAVENOUS
  Filled 2021-08-17: qty 2

## 2021-08-17 NOTE — ED Provider Triage Note (Signed)
Emergency Medicine Provider Triage Evaluation Note ? ?Thor Nannini , a 36 y.o. male  was evaluated in triage.  Pt complains of abdominal pain x 3 days. Reports fever at home, did not check temperature.  ? ?Review of Systems  ?Positive: Abdominal pain, intermittent vomiting, diarrhea, fever ?Negative: Chest pain, SOB, urinary sx ? ?Physical Exam  ?BP 114/81 (BP Location: Right Arm)   Pulse 76   Temp 98.5 ?F (36.9 ?C) (Oral)   Resp 16   SpO2 98%  ?Gen:   Awake, no distress   ?Resp:  Normal effort  ?MSK:   Moves extremities without difficulty  ?Other:   ? ?Medical Decision Making  ?Medically screening exam initiated at 6:43 PM.  Appropriate orders placed.  Lizandro Spellman was informed that the remainder of the evaluation will be completed by another provider, this initial triage assessment does not replace that evaluation, and the importance of remaining in the ED until their evaluation is complete. ? ? ?  ?Su Monks, PA-C ?08/17/21 1843 ? ?

## 2021-08-17 NOTE — ED Triage Notes (Signed)
Patient central abdominal pain that started three days ago and intermittent emesis. Patient reports diarrhea fevers at home. Patient is alert, oriented, speaking in complete sentences, and is in no apparent distress at this time. ?

## 2021-08-17 NOTE — Discharge Instructions (Addendum)
You are seen in the emergency department today for abdominal pain, nausea, vomiting and diarrhea.  You likely have a viral infection.  Your labs and imaging were normal.  I have prescribed you 2 medications.  He will take Zofran every 6 hours as needed for vomiting.  I have also prescribed you Bentyl which can be used for diarrhea.  Please eat a bland diet and drink plenty of fluids.  Please return to the emergency department for worsening symptoms such as swelling of your belly, blood in your stool or vomit or inability to drink fluids. ?

## 2021-08-17 NOTE — ED Notes (Signed)
RN reviewed discharge instructions with pt. Pt verbalized understanding and had no further questions. VSS upon discharge.  

## 2021-08-17 NOTE — ED Provider Notes (Signed)
?[facility] ?Provider Note ? ? ?CSN: [id number] ?Arrival date & time: 08/17/21  1803 ? ?  ? ?History ? ?Chief Complaint  ?Patient presents with  ? Abdominal Pain  ? ? ?Travis Estrada is a 36 y.o. male.  With no pertinent past medical history presents to the emergency department with abdominal pain. ? ?States that he has had abdominal pain for around 2 days now.  He describes it as periumbilical, intermittent and sharp.  He states that he has had intermittent chills since yesterday after work as well as 3 episodes of nonbloody vomiting, intermittent diarrhea.  He denies urinary symptoms.  Denies chest pain or shortness of breath.  Denies previous abdominal surgeries. ? ? ?Abdominal Pain ?Associated symptoms: diarrhea, fever, nausea and vomiting   ?Associated symptoms: no chest pain, no dysuria and no shortness of breath   ? ?  ? ?Home Medications ?Prior to Admission medications   ?Medication Sig Start Date End Date Taking? Authorizing Provider  ?albuterol (VENTOLIN HFA) 108 (90 Base) MCG/ACT inhaler Inhale 2 puffs into the lungs every 6 (six) hours as needed for wheezing or shortness of breath. 08/11/19   Horald Pollen, MD  ?   ? ?Allergies    ?Patient has no known allergies.   ? ?Review of Systems   ?Review of Systems  ?Constitutional:  Positive for fever.  ?Respiratory:  Negative for shortness of breath.   ?Cardiovascular:  Negative for chest pain.  ?Gastrointestinal:  Positive for abdominal pain, diarrhea, nausea and vomiting.  ?Genitourinary:  Negative for dysuria and testicular pain.  ?All other systems reviewed and are negative. ? ?Physical Exam ?Updated Vital Signs ?BP 117/72   Pulse 65   Temp 98.5 ?F (36.9 ?C) (Oral)   Resp (!) 26   SpO2 98%  ?Physical Exam ?Vitals and nursing note reviewed.  ?Constitutional:   ?   General: He is not in acute distress. ?   Appearance: He is well-developed. He is ill-appearing. He is not toxic-appearing.  ?HENT:  ?   Head:  Normocephalic and atraumatic.  ?   Mouth/Throat:  ?   Mouth: Mucous membranes are moist.  ?   Pharynx: Oropharynx is clear.  ?Eyes:  ?   General: No scleral icterus. ?   Extraocular Movements: Extraocular movements intact.  ?   Pupils: Pupils are equal, round, and reactive to light.  ?Cardiovascular:  ?   Rate and Rhythm: Normal rate and regular rhythm.  ?   Heart sounds: Normal heart sounds. No murmur heard. ?Pulmonary:  ?   Effort: Pulmonary effort is normal. No respiratory distress.  ?Abdominal:  ?   General: Abdomen is protuberant. Bowel sounds are normal. There is no distension.  ?   Palpations: Abdomen is soft.  ?   Tenderness: There is abdominal tenderness in the periumbilical area. There is rebound. There is no right CVA tenderness or left CVA tenderness. Negative signs include Murphy's sign.  ?   Hernia: No hernia is present.  ?Skin: ?   General: Skin is warm and dry.  ?   Capillary Refill: Capillary refill takes less than 2 seconds.  ?   Findings: No rash.  ?Neurological:  ?   General: No focal deficit present.  ?   Mental Status: He is alert and oriented to person, place, and time.  ?Psychiatric:     ?   Mood and Affect: Mood normal.     ?   Behavior: Behavior normal.     ?  Thought Content: Thought content normal.     ?   Judgment: Judgment normal.  ? ? ?ED Results / Procedures / Treatments   ?Labs ?(all labs ordered are listed, but only abnormal results are displayed) ?Labs Reviewed  ?COMPREHENSIVE METABOLIC PANEL - Abnormal; Notable for the following components:  ?    Result Value  ? Sodium 134 (*)   ? Glucose, Bld 123 (*)   ? Calcium 8.3 (*)   ? All other components within normal limits  ?URINALYSIS, ROUTINE W REFLEX MICROSCOPIC - Abnormal; Notable for the following components:  ? Color, Urine STRAW (*)   ? Hgb urine dipstick SMALL (*)   ? All other components within normal limits  ?RESP PANEL BY RT-PCR (FLU A&B, COVID) [id number]  ?LIPASE, BLOOD  ?CBC  ? ?EKG ?None ? ?Radiology ?CT Abdomen Pelvis W  Contrast ? ?Result Date: 08/17/2021 ?CLINICAL DATA:  Abdominal pain for 3 days with nausea, vomiting, diarrhea, and fever. EXAM: CT ABDOMEN AND PELVIS WITH CONTRAST TECHNIQUE: Multidetector CT imaging of the abdomen and pelvis was performed using the standard protocol following bolus administration of intravenous contrast. RADIATION DOSE REDUCTION: This exam was performed according to the departmental dose-optimization program which includes automated exposure control, adjustment of the mA and/or kV according to patient size and/or use of iterative reconstruction technique. CONTRAST:  18mL OMNIPAQUE IOHEXOL 300 MG/ML  SOLN COMPARISON:  None. FINDINGS: Lower chest: No acute abnormality. Hepatobiliary: Hypervascular focus is present in the left lobe of the liver, segment 2, measuring 9 mm, possible flash filling hemangioma. Fatty infiltration of the liver is noted. The gallbladder is without stones. No biliary ductal dilatation. Pancreas: Unremarkable. No pancreatic ductal dilatation or surrounding inflammatory changes. Spleen: Normal in size without focal abnormality. Adrenals/Urinary Tract: No adrenal nodule or mass. The kidneys enhance symmetrically. No renal calculus or hydronephrosis. The bladder is unremarkable Stomach/Bowel: The stomach is within normal limits. No bowel obstruction, free air, or pneumatosis. No bowel wall thickening. A normal appendix is seen in the right lower quadrant. Vascular/Lymphatic: No significant vascular findings are present. No enlarged abdominal or pelvic lymph nodes. Reproductive: Prostate is unremarkable. Other: A fat containing inguinal hernias present on the right. There is diastasis of the rectus abdominus with a small fat containing umbilical hernia. No ascites. Musculoskeletal: No acute osseous abnormality. IMPRESSION: 1. No acute intra-abdominal process. 2. Hepatic steatosis with likely small flash filling hemangioma. Electronically Signed   By: Brett Fairy M.D.   On:  08/17/2021 23:11   ? ?Procedures ?Procedures  ? ?Medications Ordered in ED ?Medications  ?sodium chloride 0.9 % bolus 1,000 mL ( Intravenous Stopped 08/17/21 2312)  ?ondansetron [facility]) injection 4 mg (4 mg Intravenous Given 08/17/21 2141)  ?morphine (PF) 4 MG/ML injection 4 mg (4 mg Intravenous Given 08/17/21 2142)  ?iohexol (OMNIPAQUE) 300 MG/ML solution 100 mL (100 mLs Intravenous Contrast Given 08/17/21 2258)  ? ?ED Course/ Medical Decision Making/ A&P ?  ?                        ?Medical Decision Making ?Amount and/or Complexity of Data Reviewed ?Labs: ordered. ?Radiology: ordered. ? ?Risk ?Prescription drug management. ? ?This patient presents to the ED for concern of abdominal pain, this involves an extensive number of treatment options, and is a complaint that carries with it a high risk of complications and morbidity.  The differential diagnosis includes acute hepatobiliary disease, pancreatitis, diverticulitis, ulcerative colitis, viral gastroenteritis, appendicitis, acute cystitis or pyelonephritis, stone, etc. ? ?  Co morbidities that complicate the patient evaluation ?None ? ?Additional history obtained:  ?Additional history obtained from: None ?External records from outside source obtained and reviewed including: Outpatient family and urgent care visits ? ?Lab Results: ?I personally ordered, reviewed, and interpreted labs. ?Pertinent results include: ?CBC within normal limits ?CMP within normal limits ?Lipase 30, negative ?COVID and flu negative ?UA negative ? ?Imaging Studies ordered:  ?I ordered imaging studies which included CT.  I independently reviewed & interpreted imaging & am in agreement with radiology impression. ?Imaging shows: ?CT abdomen pelvis with contrast negative ? ?Medications  ?I ordered medication including IV fluids for nausea and vomiting, Zofran and morphine for nausea and pain ?Reevaluation of the patient after medication shows that patient improved ? ?ED Course: ?36 year old male  who presents to the emergency department with abdominal pain, vomiting and diarrhea.  The second to benign infectious colitis. ? ?He has passing flatus and stool, no abdominal distention previous surgeries concerning for

## 2021-08-17 NOTE — ED Notes (Signed)
Patient transported to CT 

## 2023-10-17 IMAGING — CT CT ABD-PELV W/ CM
2 of 4 series · 16 of 46 positions shown, 18 images · IV contrast (Omni 300)
Comparison: None.

CLINICAL DATA: Abdominal pain for 3 days with nausea, vomiting,
diarrhea, and fever.

EXAM:
CT ABDOMEN AND PELVIS WITH CONTRAST
TECHNIQUE: Multidetector CT imaging of the abdomen and pelvis was performed
using the standard protocol following bolus administration of
intravenous contrast.

[Series 3: a/p w/ 5mm · axial · 0.79mm/px · z∈[-405,+5]mm · 13 of 90 slices shown, 15 images]
[im 4/90  soft-tissue]
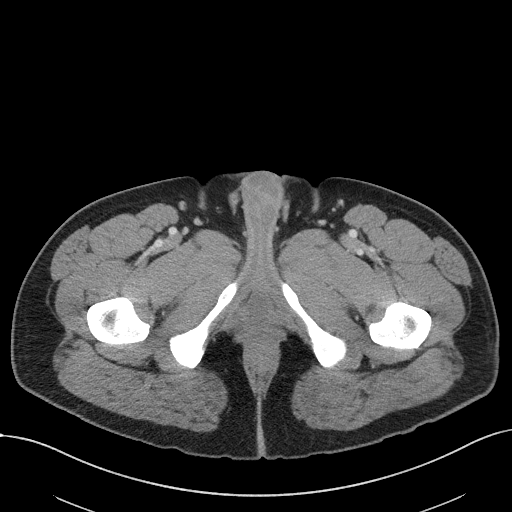
[im 4/90  bone]
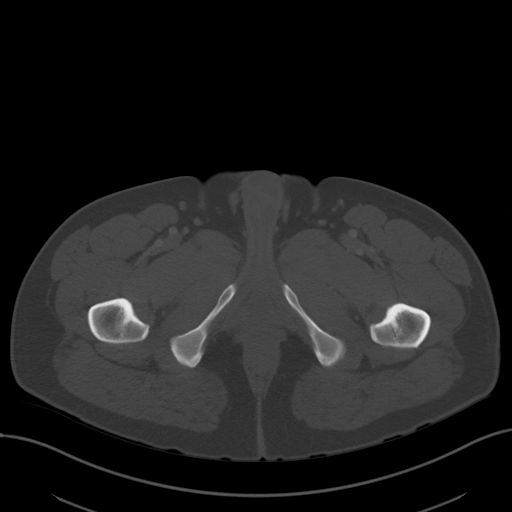
[im 12/90  soft-tissue]
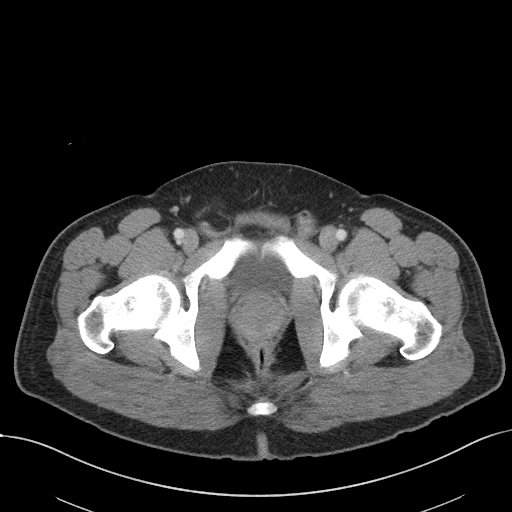
[im 20/90  soft-tissue]
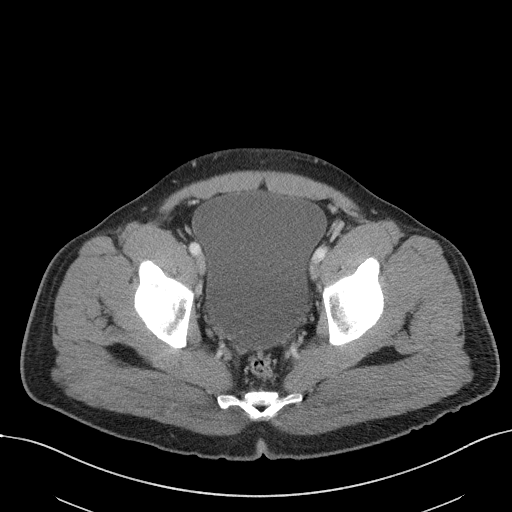
[im 24/90  soft-tissue]
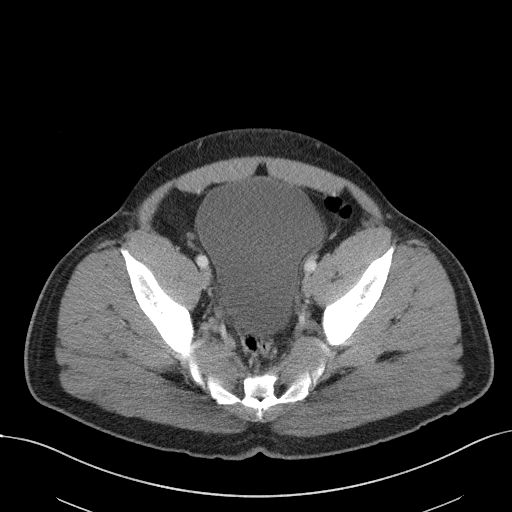
[im 31/90  soft-tissue]
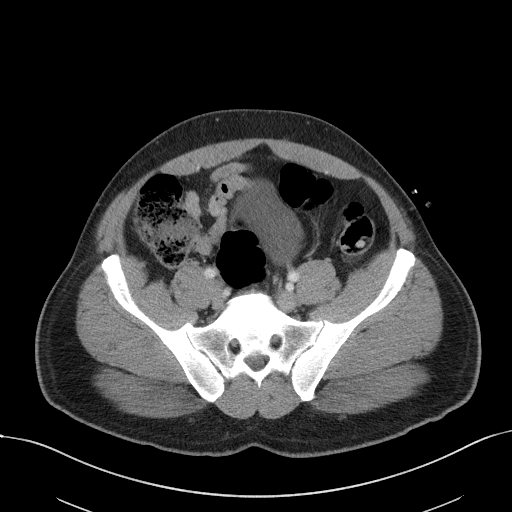
[im 39/90  soft-tissue]
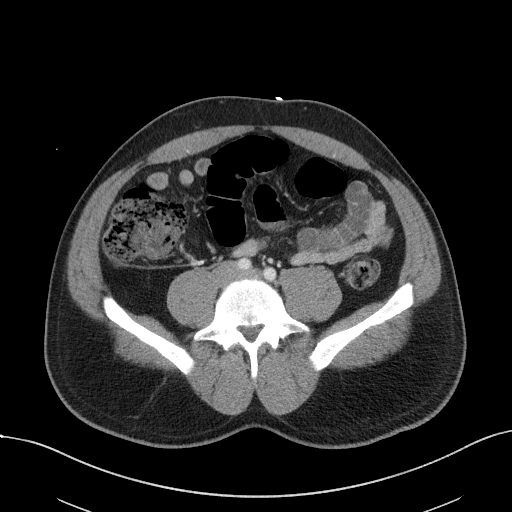
[im 47/90  soft-tissue]
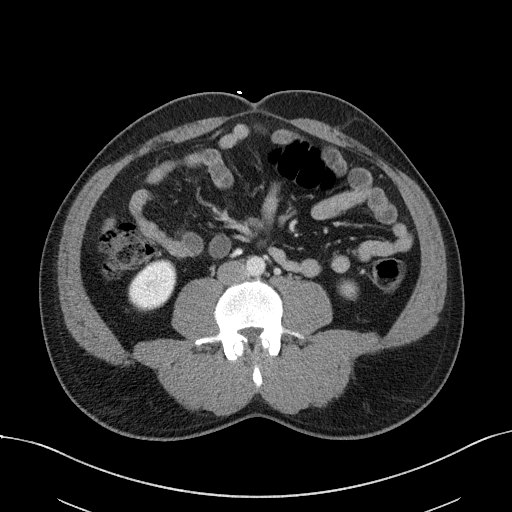
[im 51/90  soft-tissue]
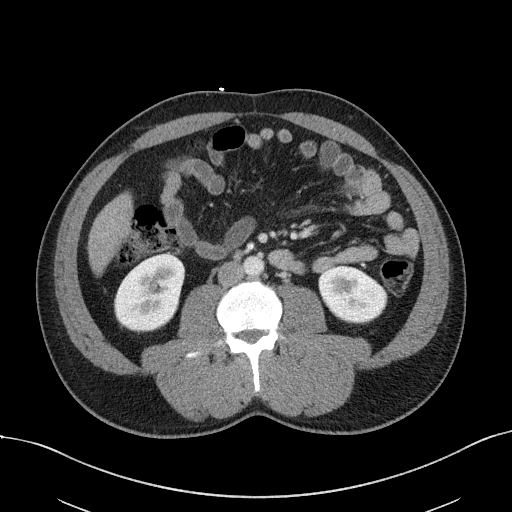
[im 59/90  soft-tissue]
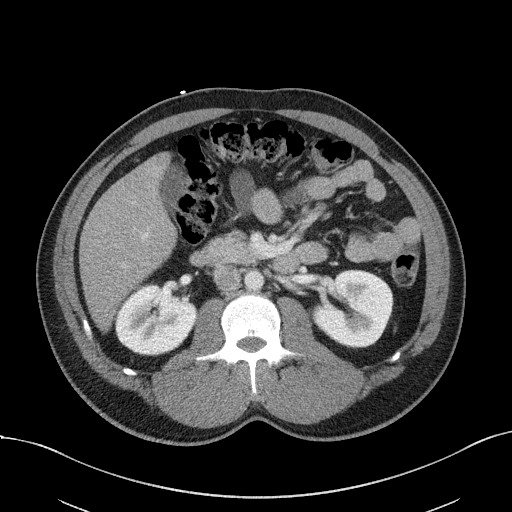
[im 59/90  bone]
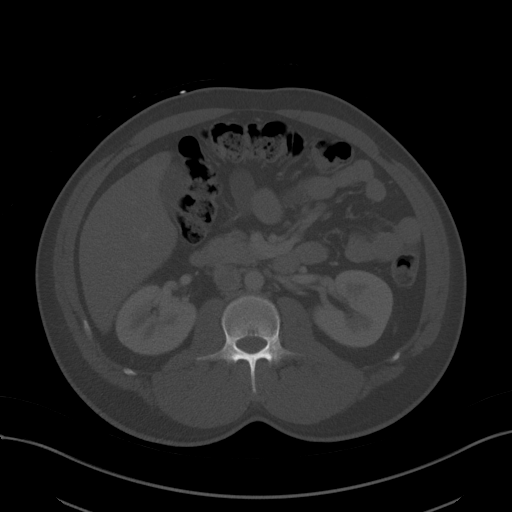
[im 66/90  soft-tissue]
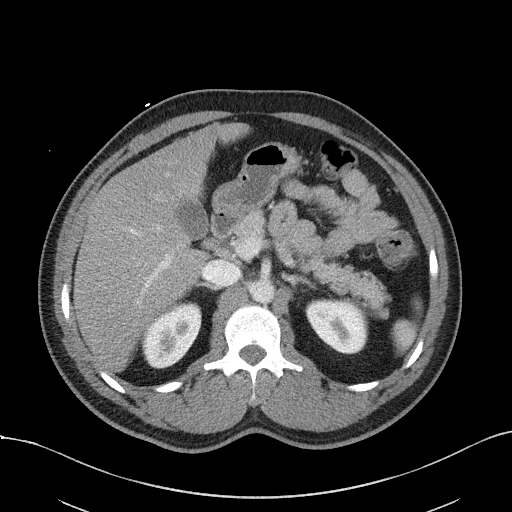
[im 70/90  soft-tissue]
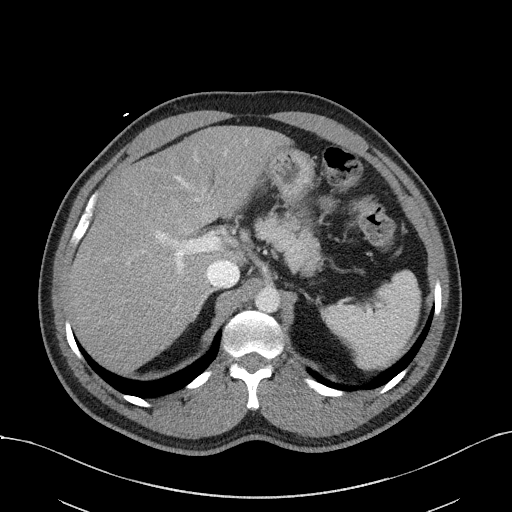
[im 78/90  soft-tissue]
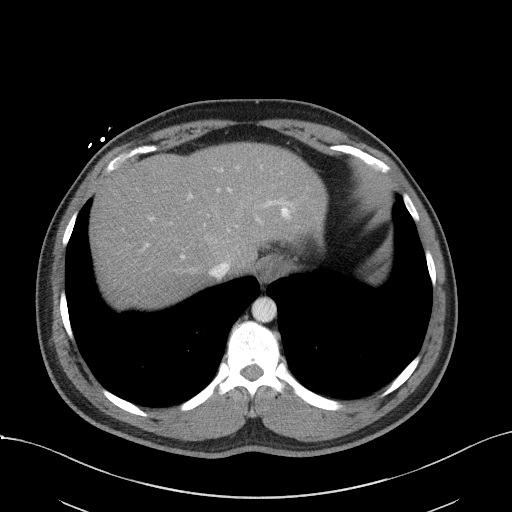
[im 86/90  soft-tissue]
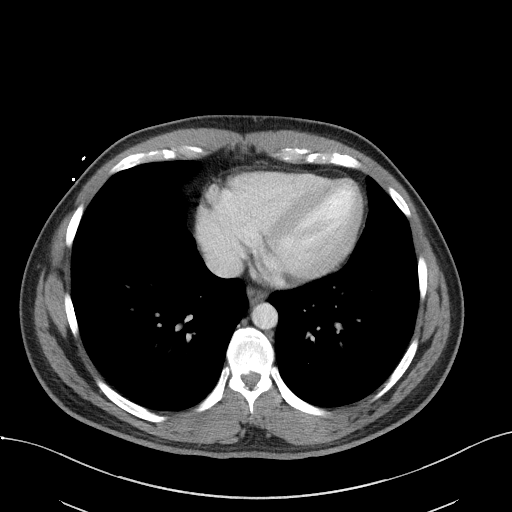

[Series 6: a/p w/ cor · coronal · 0.81mm/px · 3 of 158 slices shown]
[im 53/158  soft-tissue]
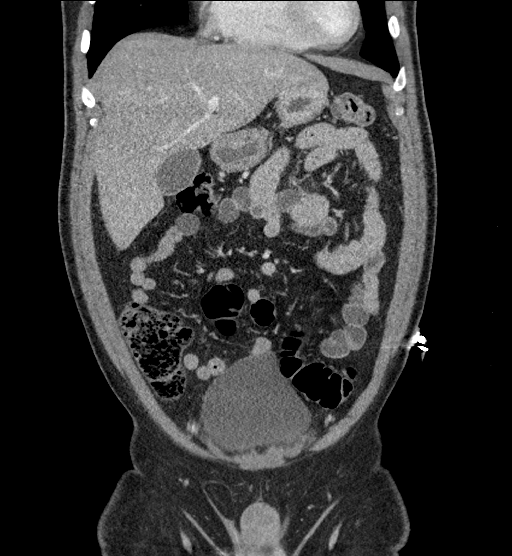
[im 70/158  soft-tissue]
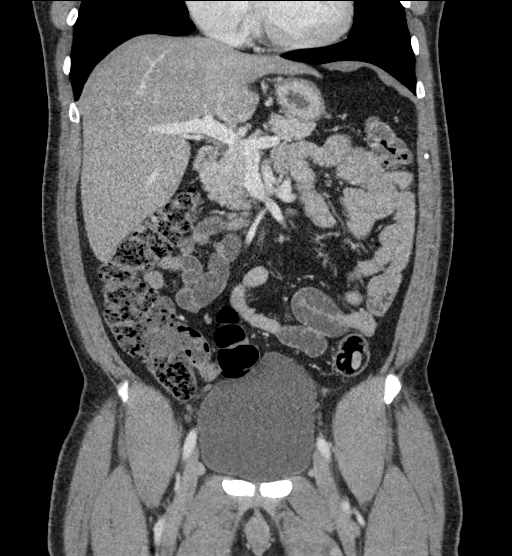
[im 88/158  soft-tissue]
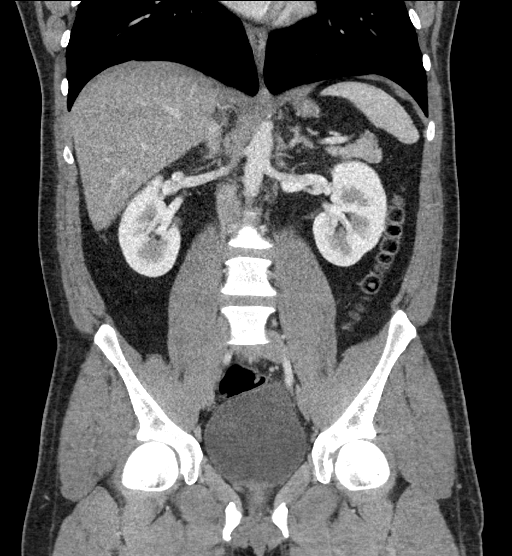

[16 of 46 positions shown; findings below may reference images not displayed]

RADIATION DOSE REDUCTION: This exam was performed according to the
departmental dose-optimization program which includes automated
exposure control, adjustment of the mA and/or kV according to
patient size and/or use of iterative reconstruction technique.

CONTRAST:  100mL OMNIPAQUE IOHEXOL 300 MG/ML  SOLN
FINDINGS: Lower chest: No acute abnormality.

Hepatobiliary: Hypervascular focus is present in the left lobe of
the liver, segment 2, measuring 9 mm, possible flash filling
hemangioma. Fatty infiltration of the liver is noted. The
gallbladder is without stones. No biliary ductal dilatation.

Pancreas: Unremarkable. No pancreatic ductal dilatation or
surrounding inflammatory changes.

Spleen: Normal in size without focal abnormality.

Adrenals/Urinary Tract: No adrenal nodule or mass. The kidneys
enhance symmetrically. No renal calculus or hydronephrosis. The
bladder is unremarkable

Stomach/Bowel: The stomach is within normal limits. No bowel
obstruction, free air, or pneumatosis. No bowel wall thickening. A
normal appendix is seen in the right lower quadrant.

Vascular/Lymphatic: No significant vascular findings are present. No
enlarged abdominal or pelvic lymph nodes.

Reproductive: Prostate is unremarkable.

Other: A fat containing inguinal hernias present on the right. There
is diastasis of the rectus abdominus with a small fat containing
umbilical hernia. No ascites.

Musculoskeletal: No acute osseous abnormality.
IMPRESSION: 1. No acute intra-abdominal process.
2. Hepatic steatosis with likely small flash filling hemangioma.
